# Patient Record
Sex: Male | Born: 1955 | Race: White | Hispanic: No | Smoking: Never smoker
Health system: Southern US, Community
[De-identification: ages and names within clinical notes are randomized; demographics above are authoritative.]

## PROBLEM LIST (undated history)

## (undated) DIAGNOSIS — C229 Malignant neoplasm of liver, not specified as primary or secondary: Secondary | ICD-10-CM

## (undated) DIAGNOSIS — E871 Hypo-osmolality and hyponatremia: Secondary | ICD-10-CM

## (undated) DIAGNOSIS — D649 Anemia, unspecified: Secondary | ICD-10-CM

## (undated) DIAGNOSIS — F1020 Alcohol dependence, uncomplicated: Secondary | ICD-10-CM

## (undated) HISTORY — DX: Anemia, unspecified: D64.9

## (undated) HISTORY — DX: Hypo-osmolality and hyponatremia: E87.1

## (undated) HISTORY — DX: Alcohol dependence, uncomplicated: F10.20

---

## 2018-09-09 ENCOUNTER — Encounter (HOSPITAL_COMMUNITY): Payer: Self-pay | Admitting: Emergency Medicine

## 2018-09-09 ENCOUNTER — Inpatient Hospital Stay (HOSPITAL_COMMUNITY)
Admission: EM | Admit: 2018-09-09 | Discharge: 2018-09-12 | DRG: 641 | Disposition: A | Discharge status: Home / Self Care | Payer: Medicaid Other | Attending: Internal Medicine | Admitting: Internal Medicine

## 2018-09-09 ENCOUNTER — Emergency Department (HOSPITAL_COMMUNITY): Payer: Medicaid Other

## 2018-09-09 DIAGNOSIS — G8929 Other chronic pain: Secondary | ICD-10-CM | POA: Diagnosis present

## 2018-09-09 DIAGNOSIS — D509 Iron deficiency anemia, unspecified: Secondary | ICD-10-CM | POA: Diagnosis present

## 2018-09-09 DIAGNOSIS — E46 Unspecified protein-calorie malnutrition: Secondary | ICD-10-CM | POA: Diagnosis present

## 2018-09-09 DIAGNOSIS — M549 Dorsalgia, unspecified: Secondary | ICD-10-CM | POA: Diagnosis present

## 2018-09-09 DIAGNOSIS — D649 Anemia, unspecified: Secondary | ICD-10-CM | POA: Diagnosis present

## 2018-09-09 DIAGNOSIS — I959 Hypotension, unspecified: Secondary | ICD-10-CM | POA: Diagnosis not present

## 2018-09-09 DIAGNOSIS — D539 Nutritional anemia, unspecified: Secondary | ICD-10-CM

## 2018-09-09 DIAGNOSIS — E871 Hypo-osmolality and hyponatremia: Secondary | ICD-10-CM | POA: Diagnosis present

## 2018-09-09 DIAGNOSIS — F1020 Alcohol dependence, uncomplicated: Secondary | ICD-10-CM | POA: Diagnosis present

## 2018-09-09 DIAGNOSIS — E876 Hypokalemia: Principal | ICD-10-CM | POA: Diagnosis present

## 2018-09-09 DIAGNOSIS — I509 Heart failure, unspecified: Secondary | ICD-10-CM

## 2018-09-09 LAB — I-STAT TROPONIN, ED: TROPONIN I, POC: 0 ng/mL (ref 0.00–0.08)

## 2018-09-09 LAB — COMPREHENSIVE METABOLIC PANEL
ALBUMIN: 2.1 g/dL — AB (ref 3.5–5.0)
ALK PHOS: 78 U/L (ref 38–126)
ALT: 11 U/L (ref 0–44)
ANION GAP: 7 (ref 5–15)
AST: 19 U/L (ref 15–41)
BILIRUBIN TOTAL: 1.2 mg/dL (ref 0.3–1.2)
BUN: 6 mg/dL — ABNORMAL LOW (ref 8–23)
CALCIUM: 7.6 mg/dL — AB (ref 8.9–10.3)
CO2: 20 mmol/L — ABNORMAL LOW (ref 22–32)
Chloride: 102 mmol/L (ref 98–111)
Creatinine, Ser: 0.59 mg/dL — ABNORMAL LOW (ref 0.61–1.24)
GLUCOSE: 89 mg/dL (ref 70–99)
POTASSIUM: 3.6 mmol/L (ref 3.5–5.1)
Sodium: 129 mmol/L — ABNORMAL LOW (ref 135–145)
TOTAL PROTEIN: 6.7 g/dL (ref 6.5–8.1)

## 2018-09-09 LAB — CBC WITH DIFFERENTIAL/PLATELET
BAND NEUTROPHILS: 1 %
BASOS PCT: 0 %
Basophils Absolute: 0 10*3/uL (ref 0.0–0.1)
Eosinophils Absolute: 0.1 10*3/uL (ref 0.0–0.5)
Eosinophils Relative: 2 %
HEMATOCRIT: 12.1 % — AB (ref 39.0–52.0)
HEMOGLOBIN: 3 g/dL — AB (ref 13.0–17.0)
LYMPHS PCT: 23 %
Lymphs Abs: 1.3 10*3/uL (ref 0.7–4.0)
MCH: 16.1 pg — ABNORMAL LOW (ref 26.0–34.0)
MCHC: 24.8 g/dL — ABNORMAL LOW (ref 30.0–36.0)
MCV: 65.1 fL — ABNORMAL LOW (ref 80.0–100.0)
MONOS PCT: 18 %
Monocytes Absolute: 1 10*3/uL (ref 0.1–1.0)
NEUTROS ABS: 3.2 10*3/uL (ref 1.7–7.7)
Neutrophils Relative %: 57 %
Platelets: 246 10*3/uL (ref 150–400)
RBC: 1.86 MIL/uL — AB (ref 4.22–5.81)
RDW: 26.6 % — ABNORMAL HIGH (ref 11.5–15.5)
WBC: 5.6 10*3/uL (ref 4.0–10.5)

## 2018-09-09 LAB — MRSA PCR SCREENING: MRSA by PCR: NEGATIVE

## 2018-09-09 LAB — POC OCCULT BLOOD, ED: Fecal Occult Bld: NEGATIVE

## 2018-09-09 LAB — PROTIME-INR
INR: 1.8
PROTHROMBIN TIME: 20.6 s — AB (ref 11.4–15.2)

## 2018-09-09 LAB — RETICULOCYTES
Immature Retic Fract: 30.7 % — ABNORMAL HIGH (ref 2.3–15.9)
RBC.: 2.73 MIL/uL — AB (ref 4.22–5.81)
RETIC COUNT ABSOLUTE: 44 10*3/uL (ref 19.0–186.0)
RETIC CT PCT: 1.6 % (ref 0.4–3.1)

## 2018-09-09 LAB — PREPARE RBC (CROSSMATCH)

## 2018-09-09 LAB — ABO/RH: ABO/RH(D): A NEG

## 2018-09-09 MED ORDER — VANCOMYCIN HCL IN DEXTROSE 1-5 GM/200ML-% IV SOLN
1000.0000 mg | Freq: Once | INTRAVENOUS | Status: AC
  Administered 2018-09-09: 1000 mg via INTRAVENOUS
  Filled 2018-09-09: qty 200

## 2018-09-09 MED ORDER — FUROSEMIDE 10 MG/ML IJ SOLN: 20.0000 mg | Freq: Two times a day (BID) | INTRAMUSCULAR | Status: DC

## 2018-09-09 MED ORDER — DEXMEDETOMIDINE HCL IN NACL 200 MCG/50ML IV SOLN
0.2000 ug/kg/h | INTRAVENOUS | Status: DC
  Administered 2018-09-09: 0.2 ug/kg/h via INTRAVENOUS
  Administered 2018-09-10: 0.5 ug/kg/h via INTRAVENOUS
  Filled 2018-09-09 (×2): qty 50

## 2018-09-09 MED ORDER — SODIUM CHLORIDE 0.9 % IV BOLUS
1000.0000 mL | Freq: Once | INTRAVENOUS | Status: AC
  Administered 2018-09-09: 1000 mL via INTRAVENOUS

## 2018-09-09 MED ORDER — VANCOMYCIN HCL IN DEXTROSE 750-5 MG/150ML-% IV SOLN
750.0000 mg | Freq: Two times a day (BID) | INTRAVENOUS | Status: DC
  Administered 2018-09-10: 750 mg via INTRAVENOUS
  Filled 2018-09-09: qty 150

## 2018-09-09 MED ORDER — ACETAMINOPHEN 325 MG PO TABS: 650.0000 mg | ORAL_TABLET | ORAL | Status: DC | PRN

## 2018-09-09 MED ORDER — FOLIC ACID 1 MG PO TABS
1.0000 mg | ORAL_TABLET | Freq: Every day | ORAL | Status: DC
  Administered 2018-09-10 – 2018-09-12 (×3): 1 mg via ORAL
  Filled 2018-09-09 (×3): qty 1

## 2018-09-09 MED ORDER — SODIUM CHLORIDE 0.9 % IV SOLN
2.0000 g | Freq: Two times a day (BID) | INTRAVENOUS | Status: DC
  Administered 2018-09-10: 2 g via INTRAVENOUS
  Filled 2018-09-09: qty 2

## 2018-09-09 MED ORDER — PANTOPRAZOLE SODIUM 40 MG IV SOLR
40.0000 mg | Freq: Two times a day (BID) | INTRAVENOUS | Status: DC
  Administered 2018-09-09 – 2018-09-10 (×2): 40 mg via INTRAVENOUS
  Filled 2018-09-09 (×2): qty 40

## 2018-09-09 MED ORDER — SODIUM CHLORIDE 0.9 % IV SOLN
10.0000 mL/h | Freq: Once | INTRAVENOUS | Status: AC
  Administered 2018-09-09: 10 mL/h via INTRAVENOUS

## 2018-09-09 MED ORDER — VITAMIN B-1 100 MG PO TABS
100.0000 mg | ORAL_TABLET | Freq: Every day | ORAL | Status: DC
  Administered 2018-09-09 – 2018-09-12 (×4): 100 mg via ORAL
  Filled 2018-09-09 (×4): qty 1

## 2018-09-09 MED ORDER — LORAZEPAM 2 MG/ML IJ SOLN: 1.0000 mg | INTRAMUSCULAR | Status: DC | PRN

## 2018-09-09 MED ORDER — SODIUM CHLORIDE 0.9 % IV SOLN
2.0000 g | Freq: Once | INTRAVENOUS | Status: AC
  Administered 2018-09-09: 2 g via INTRAVENOUS
  Filled 2018-09-09: qty 2

## 2018-09-09 NOTE — Progress Notes (Signed)
Pharmacy Antibiotic Note  Gregory Rowe is a 62 y.o. male admitted on 09/09/2018 with pneumonia.  Pharmacy has been consulted for cefepime and vancomycin dosing.  Currently, afebrile (Tmax 100.3) and WBC wnl. Estimated CrCl 74 mL/min per weight 150 kg provided by patient.   Plan: Cefepime 2 gm IV once per MD, then 2 gm IV q12h Vancomycin 1000 mg IV once per MD, then 750 mg IV q12h Monitor renal function and vancomycin trough at steady state F/u cultures, length of therapy, and weight     Temp (24hrs), Avg:99.7 F (37.6 C), Min:99 F (37.2 C), Max:100.3 F (37.9 C)  Recent Labs  Lab 09/09/18 1417 09/09/18 1430  WBC  --  5.6  CREATININE 0.59*  --     CrCl cannot be calculated (Unknown ideal weight.).    Allergies  Allergen Reactions  . Other Other (See Comments)    NARCOTICS; makes him angry    Antimicrobials this admission: Cefepime 11/7>> Vancomycin 11/7>>  Microbiology results: 11/7 BCx:  Thank you for allowing pharmacy to be a part of this patient's care.  Willia Craze, Pharmacy Student

## 2018-09-09 NOTE — ED Provider Notes (Signed)
[facility] EMERGENCY DEPARTMENT Provider Note   CSN: [id number] Arrival date & time: 09/09/18  1331     History   Chief Complaint No chief complaint on file.   HPI Gregory Rowe is a 62 y.o. male who presents with cc of generalized weakness.  He has a past medical history of alcohol abuse and does not follow regularly with a physician.  Patient states that he used to drink a case to a case and a half daily however he states that he only drinks about 5-6 beers daily but he does drink all day long.  The history is gathered from the patient, his son who is at bedside and a written statement from the patient's live-in girlfriend.  Apparently about 4 to 5 weeks ago the patient began getting swelling in his lower extremities along with significant exertional dyspnea.  Over the past 3 weeks he has had significantly worsening weakness and decline including having to use a cane or a walker and also the point that he is unable to get out of bed or help himself to ADLs.  He denies melena or hematochezia.  His son says he has not seen him in about 6 weeks and has noticed that he looks very pale.  His son also notices that over the past year he has had a 60 to 80 pound weight loss.  This has been unintentional.  The patient denies abdominal pain or distention.  He denies any known melena or hematochezia.  He has not been taking BC powders, or other NSAIDs on a regular basis.  She denies fevers or soaking night sweats.  He denies unilateral leg swelling, chest pain, hemoptysis.  He does acknowledge orthopnea.  HPI  No past medical history on file.  There are no active problems to display for this patient.         Home Medications    Prior to Admission medications   Not on File    Family History No family history on file.  Social History Social History   Tobacco Use  . Smoking status: Not on file  Substance Use Topics  . Alcohol use: Not on file  . Drug use: Not on  file     Allergies   Patient has no known allergies.   Review of Systems Review of Systems Ten systems reviewed and are negative for acute change, except as noted in the HPI.    Physical Exam Updated Vital Signs There were no vitals taken for this visit.  Physical Exam  Constitutional: He is oriented to person, place, and time. He appears well-developed and well-nourished.  Patient is extremely pale and appears older than stated age  HENT:  Head: Normocephalic and atraumatic.  Pale conjunctiva and pale oral mucosa  Eyes: Pupils are equal, round, and reactive to light. EOM are normal.  Neck: Normal range of motion. Neck supple.  Difficult to assess JVD secondary to facial hair  Cardiovascular: Normal rate and regular rhythm.  Pulmonary/Chest: Effort normal and breath sounds normal.  Abdominal: He exhibits distension. There is no tenderness.  Firm, distended abdomen without tenderness, rebound or guarding  Genitourinary:  Genitourinary Comments: Digital rectal exam reveals nonthrombosed external hemorrhoids, rectal exam with normal tone, brown stool on examining finger.  Musculoskeletal: He exhibits edema.  Patient with anasarca of the lower extremities, 3+ pitting edema.  Edema extends up to the navel with 1+ pitting edema of the lower abdomen.  Neurological: He is alert and oriented to person,  place, and time.  Nursing note and vitals reviewed.    ED Treatments / Results  Labs (all labs ordered are listed, but only abnormal results are displayed) Labs Reviewed - No data to display  EKG None  Radiology No results found.  Procedures .Critical Care Performed by: Margarita Mail, PA-C Authorized by: Margarita Mail, PA-C   Critical care provider statement:    Critical care time (minutes):  60   Critical care was necessary to treat or prevent imminent or life-threatening deterioration of the following conditions:  Circulatory failure, metabolic crisis and sepsis    Critical care was time spent personally by me on the following activities:  Discussions with consultants, evaluation of patient's response to treatment, examination of patient, ordering and performing treatments and interventions, ordering and review of laboratory studies, ordering and review of radiographic studies, pulse oximetry, re-evaluation of patient's condition, obtaining history from patient or surrogate and review of old charts   (including critical care time)  Medications Ordered in ED Medications - No data to display   Initial Impression / Assessment and Plan / ED Course  I have reviewed the triage vital signs and the nursing notes.  Pertinent labs & imaging results that were available during my care of the patient were reviewed by me and considered in my medical decision making (see chart for details).  Clinical Course as of Sep 10 1623  Thu Sep 09, 2018  1421 This is a 62 year old male who appears extremely pale and extremely anemic.  I have concern given his history of alcohol abuse of hepatorenal failure or insufficiency, alcoholic cardiomyopathy, extreme anemia from GI bleed.  Also on my differential is cancer.  The patient's work-up is currently pending.  The patient was found  be febrile to 100.3.  I doubt spontaneous bacterial peritonitis.   [AH]  1536 Sodium(!): 129 [AH]  1536 Calcium(!): 7.6 [AH]  1536    Albumin(!): 2.1 [AH]  1545 Fecal Occult Blood, POC: NEGATIVE [AH]  1617 patient   [AH]  1620 Patient hgb 3.0  I had a long conversation with the lab who had tried to run the lab x2 on 2 separate blood draws.  They had to take a sensor on the machine that they had to remove. I have ordered 2 units of blood  and 2 units ahead.   [AH]    Clinical Course User Index [AH] Margarita Mail, PA-C    Patient with critically low hemoglobin.  He is getting 2 units of packed red blood cells currently.  I spoke with Dr. Elsworth Soho of critical care who will consult on the patient.   I am concerned with his third spacing and need for fluids as well as potential for early sepsis and pneumonia that he may have significant decompensation of his respiratory status as well as his tachypnea. patient signed out given to Dr. Lavell Islam and Dr. Sherry Ruffing.  Final Clinical Impressions(s) / ED Diagnoses   Final diagnoses:  None    ED Discharge Orders    None       Margarita Mail, PA-C 09/09/18 1654    Pattricia Boss, MD 09/09/18 2035

## 2018-09-09 NOTE — ED Triage Notes (Signed)
Patient is brought to the ED by son. Son reports visiting yesterday and patient is weak and using walker which is not "normal."

## 2018-09-09 NOTE — ED Notes (Signed)
CC MD at bedside 

## 2018-09-09 NOTE — ED Notes (Signed)
No medical history obtained from patient and son. Reports, that patient does not "see any doctors"

## 2018-09-09 NOTE — ED Notes (Signed)
ED Provider at bedside. 

## 2018-09-09 NOTE — ED Notes (Addendum)
Patient said he does not want blood until his son comes back to the room. Son is on the way EDP notified

## 2018-09-09 NOTE — H&P (Signed)
NAME:  Gregory Rowe, MRN:  [id number], DOB:  October 19, 1956, LOS: 0 ADMISSION DATE:  09/09/2018, CONSULTATION DATE:  09/09/2018 REFERRING MD:  Dr. Sherry Ruffing, CHIEF COMPLAINT:  Weakness/ SOB  Brief History   41 yoM with heavy ETOH use- last drink 11/7, no known significant history but hasnt seen provider in years presenting with severe progressive weakness, SOB, and BLE swelling.  Hgb at 3 with borderline hypotensive blood pressure.   Past Medical History  ETOH use, hip fx 7-10 years ago, chronic back pain  Significant Hospital Events   11/7 Admit  Consults: date of consult/date signed off & final recs:   Procedures (surgical and bedside):  Bilateral 20g PIV  Significant Diagnostic Tests:  11/7 CXR  >> Right greater than left effusions and airspace disease which could be atelectasis or pneumonia.  Cardiomegaly without edema.  Atherosclerosis  Micro Data:  11/7 MRSA PCR >> 11/7 BCx 2 >>  Antimicrobials:  11/7 vanc x 1 11/7 cefepime x 1 in ER  Subjective:  No complaints.  Dyspnea only with exertion.  Hungry asking for cheeseburger.   Objective   Blood pressure (!) 113/55, pulse 88, temperature 99 F (37.2 C), temperature source Oral, resp. rate (!) 29, SpO2 100 %. on room air.        No intake or output data in the 24 hours ending 09/09/18 1740 There were no vitals filed for this visit.  Examination: General:  Older than stated age adult male sitting in bed in NAD HEENT: MM pale/ moist, no apparent JVD, pupils 3/reactive, anicteric  Neuro: Alert, oriented, non focal, generalized weakness  CV: RR, SR 80's, +2 pulses, no murmur PULM: even/non-labored, tachypneic, lungs bilaterally clear anteriorly, diminished in bases  GI: minimally taut, non tender, no obvious fluid wave or hepatomegaly, +BS Extremities: warm/dry, 2-3+ pitting edema up to thighs Skin: no rashes, pale  Resolved Hospital Problem list    Assessment & Plan:  Hypervolemia- will need to rule out heart failure  vs cirrhosis, concern for cardiomyopathy with long-term heavy ETOH,  -  LFTs are remarkably normal  - negative trop and non acute EKG  P:  Tele monitoring Goal MAP > 65 TTE  Lasix 20mg  q 12 hours Strict I/O's and daily weights    Bilateral pleural effusions +/- atelectasis vs infiltrate - currently maintaining saturations on room air  - denies prior infectious symptoms, WBC normal  P:  Diuresis as above Continue vanc/ cefepime for now Check PCT, if negative, likely will d/c abx    ETOH - heavy and chronic, last drink around noon 11/7 - normal LFTs  - normal mentation without current signs of withdrawal  P:  High risk for withdrawal Precedex per CIWA protocol  Daily thiamine and folate Prn Ativan for seizures    Microcytic Anemia  - neg FOBT in ER - likely chronic given progressive nature of symptoms P:  Receiving 2 units PRBC now Recheck Hgb after transfusion Goal > 7 Iron studies ordered with B1 level  Check occult stool x 3 Consider GI evaluation pending iron studies/ ETOH hx  PPI BID for now   Hyponatremia - hypervolemic/ hypotonic -serum Osm calculated without current ETOH level at 265 Hypokalemia- mild  P:  Check mag/ phos  check UA  Diuresis as above Repeat BMET in am   Disposition / Summary of Today's Plan 09/09/18   Admit to ICU overnight for observation.  If remains stable can likely transfer out tomorrow pending studies and if remains hemodynamically stable and  off precedex.    Diet: Full liquid Pain/Anxiety/Delirium protocol (if indicated): precedex prn CIWA protocol  VAP protocol (if indicated): n/a DVT prophylaxis: SCDs only for now GI prophylaxis: protonix BID Hyperglycemia protocol: n/a  Mobility: BR Code Status: Full  Family Communication: Patient and his son, Izell  [phone] or [phone]) updated at bedside on plan of care.   Labs   CBC: Recent Labs  Lab 09/09/18 1430  WBC 5.6  NEUTROABS 3.2  HGB 3.0*  HCT 12.1*  MCV  65.1*  PLT 638    Basic Metabolic Panel: Recent Labs  Lab 09/09/18 1417  NA 129*  K 3.6  CL 102  CO2 20*  GLUCOSE 89  BUN 6*  CREATININE 0.59*  CALCIUM 7.6*   GFR: CrCl cannot be calculated (Unknown ideal weight.). Recent Labs  Lab 09/09/18 1430  WBC 5.6    Liver Function Tests: Recent Labs  Lab 09/09/18 1417  AST 19  ALT 11  ALKPHOS 78  BILITOT 1.2  PROT 6.7  ALBUMIN 2.1*   No results for input(s): LIPASE, AMYLASE in the last 168 hours. No results for input(s): AMMONIA in the last 168 hours.  ABG No results found for: PHART, PCO2ART, PO2ART, HCO3, TCO2, ACIDBASEDEF, O2SAT   Coagulation Profile: Recent Labs  Lab 09/09/18 1417  INR 1.80    Cardiac Enzymes: No results for input(s): CKTOTAL, CKMB, CKMBINDEX, TROPONINI in the last 168 hours.  HbA1C: No results found for: HGBA1C  CBG: No results for input(s): GLUCAP in the last 168 hours.  Admitting History of Present Illness.   62 year old male with medical history significant for ETOH use.  He drinks 5-6 beers a day but that is recently down from around 24 beers a day as he states he just hasn't felt like drinking.  Last drink was today around noon.  He has had heavy ETOH use for years with questionable withdrawals when hospitalized for his hip fracture secondary to fall 7-10 years.  Has not seen a provider since he fractured his hip.  He lives in [place] with his girlfriend.  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    He present after his son found him yesterday at home pale and with weakness so severe, he was unable to get out of bed.  Reports progressive weakness over the last 6 months to where he has been using a walker, bilateral lower leg swelling over 1-2 months, and dyspnea with exertion for same.  Some orthopnea he attributes to not being able to lay flat due to back pain.  He denies any OTC or RX medication use.  Denies taking medication for back pain.  He is a  never smoker, only exposure to second hand smoke.  Reports a good appetite without any changes in bowel or bloody stools. Son thinks he has had significant weight loss over the last 6 months to year, which has been unintentional.  Denies fever, chills, cough, hemoptysis, rectal bleeding, chest or abdominal pain, or urinary changes.  SOB only exertional.    In ER, blood pressure soft with systolic in the 75-64'P with some improvement after 1L bolus in ER,  temp 100.3, tachypnea in the upper 20's and NSR in the 80's.  EKG without acute changes, trop neg, Hgb noted to be 3, HCT 12.1 with negative FOBT, WBC 5.6, Na 129, K 3.6, sCr 0.59, INR 1.80, normal LFTs, CXR with bilateral pleural effusion, right greater than left.  PCCM  called for admission.    Review of Systems:   POSITIVES IN BOLD Gen: Denies fever, chills, weight loss, fatigue, night sweats HEENT: Denies blurred vision, double vision, hearing loss, tinnitus, sinus congestion, rhinorrhea, sore throat, neck stiffness, dysphagia PULM: Denies shortness of breath, cough, sputum production, hemoptysis, wheezing CV: Denies chest pain, edema, orthopnea, paroxysmal nocturnal dyspnea, palpitations GI: Denies abdominal pain, nausea, vomiting, diarrhea, hematochezia, melena, constipation, change in bowel habits GU: Denies dysuria, hematuria, polyuria, oliguria, urethral discharge Endocrine: Denies hot or cold intolerance, polyuria, polyphagia or appetite change Derm: Denies rash or skin change Heme: Denies easy bruising, bleeding Neuro: Denies headache, dizziness, numbness, generalized weakness, slurred speech, loss of memory or consciousness  Past Medical History  He,  has no past medical history on file.   Surgical History   Prior hx fracture 7-10 years ago, treated in [facility].  Social History   Social History   Socioeconomic History  . Marital status: Single    Spouse name: Not on file  . Number of children: Not on file  . Years of  education: Not on file  . Highest education level: Not on file  Occupational History  . Not on file  Social Needs  . Financial resource strain: Not on file  . Food insecurity:    Worry: Not on file    Inability: Not on file  . Transportation needs:    Medical: Not on file    Non-medical: Not on file  Tobacco Use  . Smoking status: Not on file  Substance and Sexual Activity  . Alcohol use: Not on file  . Drug use: Not on file  . Sexual activity: Not on file  Lifestyle  . Physical activity:    Days per week: Not on file    Minutes per session: Not on file  . Stress: Not on file  Relationships  . Social connections:    Talks on phone: Not on file    Gets together: Not on file    Attends religious service: Not on file    Active member of club or organization: Not on file    Attends meetings of clubs or organizations: Not on file    Relationship status: Not on file  . Intimate partner violence:    Fear of current or ex partner: Not on file    Emotionally abused: Not on file    Physically abused: Not on file    Forced sexual activity: Not on file  Other Topics Concern  . Not on file  Social History Narrative  . Not on file  ,     Family History   His family history is not on file.   Allergies Allergies  Allergen Reactions  . Other Other (See Comments)    NARCOTICS; makes him angry     Home Medications  Prior to Admission medications   Not on File     Critical care time: 60 mins     Kennieth Rad, AGACNP-BC [place] Pgr: [phone] or if no answer [phone] 09/09/2018, 6:32 PM

## 2018-09-09 NOTE — ED Provider Notes (Signed)
Transfer of Care Note I assumed care of patient from Margarita Mail at 1600.  Agree with history, physical exam and plan.  See their note for further details.    Briefly, the patient is a 62 yo male with PMHx of EtOH abuse who presents with generalized weakness. Patient has been experiencing worsening edema, orthopnea, and weakness over the past three weeks. It has now gotten to the point that the patient is so week he has been unable to get out of his bed without assistance for the past few days.   Current plan is as follows: The ICU has been contacted for admission as the patient has multiple laboratory abnormalities and a chest xray that is concerning for pneumonia. Given patient's hemoglobin of 3, we are in the process of consenting the patient for blood.   Reassessment: I personally reassessed the patient. He consented to receiving blood products and they were started. The ICU provider assessed the patient in the ED and he was accepted to their service for further evaluation and care.   HDS at transfer.  The care of this patient was supervised by Dr. Sherry Ruffing, who agreed with the plan and management of the patient.   Clinical Impression: 1. Symptomatic anemia     ED Dispo: Admit   Chrisha Vogel, Chanda Busing, MD 09/10/18 [place], Gwenyth Allegra, MD 09/10/18 [phone]

## 2018-09-10 ENCOUNTER — Inpatient Hospital Stay (HOSPITAL_COMMUNITY): Payer: Medicaid Other

## 2018-09-10 ENCOUNTER — Other Ambulatory Visit: Payer: Self-pay

## 2018-09-10 ENCOUNTER — Encounter (HOSPITAL_COMMUNITY): Payer: Self-pay | Admitting: General Practice

## 2018-09-10 LAB — POCT I-STAT 3, ART BLOOD GAS (G3+)
ACID-BASE DEFICIT: 6 mmol/L — AB (ref 0.0–2.0)
Bicarbonate: 18.8 mmol/L — ABNORMAL LOW (ref 20.0–28.0)
O2 Saturation: 97 %
PH ART: 7.354 (ref 7.350–7.450)
TCO2: 20 mmol/L — ABNORMAL LOW (ref 22–32)
pCO2 arterial: 33.7 mmHg (ref 32.0–48.0)
pO2, Arterial: 93 mmHg (ref 83.0–108.0)

## 2018-09-10 LAB — HIV ANTIBODY (ROUTINE TESTING W REFLEX): HIV Screen 4th Generation wRfx: NONREACTIVE

## 2018-09-10 LAB — BASIC METABOLIC PANEL
Anion gap: 5 (ref 5–15)
BUN: 7 mg/dL — ABNORMAL LOW (ref 8–23)
CHLORIDE: 104 mmol/L (ref 98–111)
CO2: 20 mmol/L — AB (ref 22–32)
CREATININE: 0.65 mg/dL (ref 0.61–1.24)
Calcium: 7.3 mg/dL — ABNORMAL LOW (ref 8.9–10.3)
GFR calc Af Amer: >60 mL/min (ref >60)
GFR calc non Af Amer: >60 mL/min (ref >60)
GLUCOSE: 92 mg/dL (ref 70–99)
POTASSIUM: 3.5 mmol/L (ref 3.5–5.1)
Sodium: 129 mmol/L — ABNORMAL LOW (ref 135–145)

## 2018-09-10 LAB — CBC
HCT: 23.8 % — ABNORMAL LOW (ref 39.0–52.0)
HCT: 28.9 % — ABNORMAL LOW (ref 39.0–52.0)
HEMATOCRIT: 28.3 % — AB (ref 39.0–52.0)
HEMOGLOBIN: 7 g/dL — AB (ref 13.0–17.0)
HEMOGLOBIN: 8.5 g/dL — AB (ref 13.0–17.0)
HEMOGLOBIN: 8.7 g/dL — AB (ref 13.0–17.0)
MCH: 22.6 pg — AB (ref 26.0–34.0)
MCH: 23.2 pg — ABNORMAL LOW (ref 26.0–34.0)
MCH: 23.4 pg — ABNORMAL LOW (ref 26.0–34.0)
MCHC: 29.4 g/dL — AB (ref 30.0–36.0)
MCHC: 30 g/dL (ref 30.0–36.0)
MCHC: 30.1 g/dL (ref 30.0–36.0)
MCV: 76.8 fL — AB (ref 80.0–100.0)
MCV: 77.1 fL — AB (ref 80.0–100.0)
MCV: 77.7 fL — ABNORMAL LOW (ref 80.0–100.0)
NRBC: 0 % (ref 0.0–0.2)
PLATELETS: 224 10*3/uL (ref 150–400)
Platelets: 229 10*3/uL (ref 150–400)
Platelets: 226 10*3/uL (ref 150–400)
RBC: 3.1 MIL/uL — AB (ref 4.22–5.81)
RBC: 3.67 MIL/uL — AB (ref 4.22–5.81)
RBC: 3.72 MIL/uL — ABNORMAL LOW (ref 4.22–5.81)
RDW: 26.7 % — ABNORMAL HIGH (ref 11.5–15.5)
RDW: 24.5 % — ABNORMAL HIGH (ref 11.5–15.5)
RDW: 24.8 % — ABNORMAL HIGH (ref 11.5–15.5)
WBC: 9.6 10*3/uL (ref 4.0–10.5)
WBC: 8.7 10*3/uL (ref 4.0–10.5)
WBC: 8 10*3/uL (ref 4.0–10.5)
nRBC: 0.2 % (ref 0.0–0.2)
nRBC: 0.4 % — ABNORMAL HIGH (ref 0.0–0.2)

## 2018-09-10 LAB — IRON AND TIBC
Iron: 114 ug/dL (ref 45–182)
Saturation Ratios: 39 % (ref 17.9–39.5)
TIBC: 295 ug/dL (ref 250–450)
UIBC: 181 ug/dL

## 2018-09-10 LAB — PROCALCITONIN: PROCALCITONIN: 0.39 ng/mL

## 2018-09-10 LAB — MAGNESIUM: Magnesium: 1.9 mg/dL (ref 1.7–2.4)

## 2018-09-10 LAB — PREPARE RBC (CROSSMATCH)

## 2018-09-10 LAB — PHOSPHORUS: Phosphorus: 3.2 mg/dL (ref 2.5–4.6)

## 2018-09-10 LAB — FERRITIN: FERRITIN: 6 ng/mL — AB (ref 24–336)

## 2018-09-10 MED ORDER — FUROSEMIDE 10 MG/ML IJ SOLN
20.0000 mg | Freq: Once | INTRAMUSCULAR | Status: AC
  Administered 2018-09-10: 20 mg via INTRAVENOUS
  Filled 2018-09-10: qty 2

## 2018-09-10 MED ORDER — SODIUM CHLORIDE 0.9% IV SOLUTION
Freq: Once | INTRAVENOUS | Status: AC
  Administered 2018-09-10: 07:00:00 via INTRAVENOUS

## 2018-09-10 MED ORDER — POTASSIUM CHLORIDE CRYS ER 20 MEQ PO TBCR
40.0000 meq | EXTENDED_RELEASE_TABLET | Freq: Once | ORAL | Status: AC
  Administered 2018-09-10: 20 meq via ORAL
  Filled 2018-09-10: qty 2

## 2018-09-10 MED ORDER — FUROSEMIDE 10 MG/ML IJ SOLN
40.0000 mg | Freq: Once | INTRAMUSCULAR | Status: AC
  Administered 2018-09-10: 40 mg via INTRAVENOUS
  Filled 2018-09-10: qty 4

## 2018-09-10 MED ORDER — SODIUM CHLORIDE 0.9% IV SOLUTION
Freq: Once | INTRAVENOUS | Status: AC
  Administered 2018-09-10: 01:00:00 via INTRAVENOUS

## 2018-09-10 MED ORDER — PANTOPRAZOLE SODIUM 40 MG PO TBEC
40.0000 mg | DELAYED_RELEASE_TABLET | Freq: Every day | ORAL | Status: DC
  Administered 2018-09-11: 40 mg via ORAL
  Filled 2018-09-10: qty 1

## 2018-09-10 MED ORDER — ACETAMINOPHEN 325 MG PO TABS: 650.0000 mg | ORAL_TABLET | Freq: Four times a day (QID) | ORAL | Status: DC | PRN

## 2018-09-10 NOTE — Progress Notes (Signed)
TEAM 1 - Stepdown/ICU TEAM  Gregory Rowe  DXI:[id number] DOB: 02-26-1956 DOA: 09/09/2018 PCP: Patient, No Pcp Per    Brief Narrative:  62 yo M with heavy ETOH use - last drink 11/7, no known significant history but hasnt seen provider in years presenting with severe progressive weakness, SOB, and BLE swelling.  In the ED he was found to have a Hgb of 3 with hypotension.   Significant Events: 11/7 admit to [facility]   Subjective: Denies any complaints. Odd affect. Continues to ready paper despite attempts to interview. Will not look at examiner. Denies any complaints whatsoever, other than feeling the K-Dur pill stuck in his throat.   Assessment & Plan:  EtOH abuse Recently cut back to 5-6 beers/day from prior 24 beers/day - high risk for withdrawal - follow in SDU for now   Profound microcytic anemia  S/p 5U total PRBC - Hgb has improved accordingly - thus far no evidence of actual blood loss, but ferritin is 6 - follow trend   Recent Labs  Lab 09/09/18 1430 09/09/18 2322 09/10/18 0432  HGB 3.0* 5.6* 7.0*    Hyponatremia  Likely beer drinkers potomania - follow trend  Recent Labs  Lab 09/09/18 1417 09/10/18 0432  NA 129* 129*    B Pleural effusions - Mild LE edema Likely due to low oncotic pressure in setting of malnutrition of EtOH abuse - albumin 2.1 - f/u TTE    DVT prophylaxis: SCDs Code Status: FULL CODE Family Communication: no family present at time of exam  Disposition Plan: stable for SDU   Consultants:  PCCM  Antimicrobials:  11/7 vanc 11/7 cefepime   Objective: Blood pressure 102/62, pulse 84, temperature 97.6 F (36.4 C), temperature source Oral, resp. rate (!) 26, weight 86.2 kg, SpO2 93 %.  Intake/Output Summary (Last 24 hours) at 09/10/2018 1025 Last data filed at 09/10/2018 0900 Gross per 24 hour  Intake 2488.56 ml  Output 650 ml  Net 1838.56 ml   Filed Weights   09/09/18 1900 09/10/18 0500  Weight: 82.9 kg 86.2 kg     Examination: General: No acute respiratory distress Lungs: mild blunting of breath sounds B bases - no wheezing  Cardiovascular: Regular rate and rhythm without murmur gallop or rub normal S1 and S2 Abdomen: Nontender, nondistended, soft, bowel sounds positive, no rebound, no ascites, no appreciable mass Extremities: 1+ B LE edema   CBC: Recent Labs  Lab 09/09/18 1430 09/09/18 2322 09/10/18 0432  WBC 5.6 11.3* 9.6  NEUTROABS 3.2  --   --   HGB 3.0* 5.6* 7.0*  HCT 12.1* 20.8* 23.8*  MCV 65.1* 75.6* 76.8*  PLT 246 257 767   Basic Metabolic Panel: Recent Labs  Lab 09/09/18 1417 09/10/18 0432  NA 129* 129*  K 3.6 3.5  CL 102 104  CO2 20* 20*  GLUCOSE 89 92  BUN 6* 7*  CREATININE 0.59* 0.65  CALCIUM 7.6* 7.3*  MG  --  1.9  PHOS  --  3.2   GFR: CrCl cannot be calculated (Unknown ideal weight.).  Liver Function Tests: Recent Labs  Lab 09/09/18 1417  AST 19  ALT 11  ALKPHOS 78  BILITOT 1.2  PROT 6.7  ALBUMIN 2.1*    Coagulation Profile: Recent Labs  Lab 09/09/18 1417  INR 1.80    Recent Results (from the past 240 hour(s))  MRSA PCR Screening     Status: None   Collection Time: 09/09/18  7:58 PM  Result Value Ref Range  Status   MRSA by PCR NEGATIVE NEGATIVE Final    Comment:        The GeneXpert MRSA Assay (FDA approved for NASAL specimens only), is one component of a comprehensive MRSA colonization surveillance program. It is not intended to diagnose MRSA infection nor to guide or monitor treatment for MRSA infections. Performed at [facility], [place] [address] [place], [place] [address]      Scheduled Meds: . folic acid  1 mg Oral Daily  . furosemide  20 mg Intravenous Q12H  . furosemide  40 mg Intravenous Once  . pantoprazole (PROTONIX) IV  40 mg Intravenous Q12H  . thiamine  100 mg Oral Daily     LOS: 1 day   Cherene Altes, MD [facility] Office  [phone] Pager - Text Page per Amion  If 7PM-7AM, please  contact night-coverage per Amion 09/10/2018, 10:25 AM

## 2018-09-10 NOTE — Progress Notes (Signed)
[name] Progress Note Patient Name: Gregory Rowe DOB: [birth year] MRN: [id number]   Date of Service  09/10/2018  HPI/Events of Note  Patient noted to be more somnolent, but arousable. S/p 2 more units PRBC given furosemide prior with 400 cc output. BP borderline.  eICU Interventions  Will obtain labs now ABG ordered     Intervention Category Major Interventions: Change in mental status - evaluation and management  Judd Lien 09/10/2018, 4:28 AM

## 2018-09-10 NOTE — Procedures (Signed)
Patient unwilling to turn on side for echo due to shortness of breath and requests test be done some other time.

## 2018-09-10 NOTE — Progress Notes (Signed)
CRITICAL VALUE ALERT  Critical Value:  Hemoglobin 5.6  Date & Time Notied:  09/10/18 0000  Provider Notified: [facility]  Orders Received/Actions taken: administer 2 PRBC

## 2018-09-10 NOTE — Progress Notes (Signed)
[facility] Progress Note Patient Name: Gregory Rowe DOB: Mar 14, 1956 MRN: [id number]   Date of Service  09/10/2018  HPI/Events of Note  H/H 7/23.8 from 5.6/20.8 after 2 units PRBC. Potassium 3.5. BP borderline low  eICU Interventions  Transfuse 1 more unit PRBC. Ordered potassium 40 meqs PO     Intervention Category Major Interventions: Hypotension - evaluation and management Intermediate Interventions: Electrolyte abnormality - evaluation and management  Judd Lien 09/10/2018, 6:23 AM

## 2018-09-10 NOTE — Plan of Care (Signed)
Patient alert and oriented.  Does have generalized weakness. Vital signs stable on room air.

## 2018-09-10 NOTE — Care Management Note (Signed)
Case Management Note  Patient Details  Name: Maribel Luis MRN: [id number] Date of Birth: [birth year]  Subjective/Objective:  62yo male presented severe progressive weakness, SOB and BLE swelling.            Action/Plan: CM met with patient/ex-wife to discuss transitional needs. Patient lives at home with his significant other, independent with ADLs, utilizes a RW/cane to assist with ambulation. Patient confirmed no health insurance, nor a local PCP, but agreeable to CM arranging a hospital f/u appointment. Hospital f/u appointment arranged with: [facility] 09/15/18 @ 0840 with Mackie Pai, PA; New Patient appointment with [facility] 09/20/18 @ 1340; AVS updated. Patient can utilize [facility] for Rx needs. Patient's family will provide transportation home. CM team will continue to follow.   Expected Discharge Date:                  Expected Discharge Plan:  Home/Self Care  In-House Referral:  Clinical Social Work  Discharge planning Services  Medication Assistance, Follow-up appt scheduled  Post Acute Care Choice:  NA Choice offered to:  NA  DME Arranged:  N/A DME Agency:  NA  HH Arranged:  NA HH Agency:  NA  Status of Service:  In process, will continue to follow  If discussed at Long Length of Stay Meetings, dates discussed:    Additional Comments:  Midge Minium RN, BSN, NCM-BC, ACM-RN [phone] 09/10/2018, 3:09 PM

## 2018-09-10 NOTE — Progress Notes (Signed)
62 year old man heavy beer drinker admitted with shortness of breath, pedal edema and found to have hemoglobin of 3 ,MCV 67 and stool occult blood negative.  No history of melena.  He was transfused 4 units PRBC with hemoglobin improving to 7 Only made 600 cc of urine with Lasix 20 On exam-complains of coughing after potassium pill was given, currently no respiratory distress, no icterus, mild pallor, 2+ bipedal edema, S1-S2 tacky, no rub or gallop, decreased breath sounds bilateral  Labs reviewed which showed low ferritin but surprisingly good iron saturation, low procalcitonin, mild hypokalemia and relatively low reticulocyte count.  Impression/plan Severe microcytic anemia -stool occult blood negative on admission but still need to check more.  Surprisingly iron saturation appears okay although ferritin very low.  Relatively low reticulocyte count also suggests decreased production. He has been transfused 1 more unit after hemoglobin of 7, total 5 U PRBCs  Congestive heart failure-obtain echo, this may be high output heart failure from severe anemia alone or may be cardiomyopathy from alcohol or thiamine deficiency. Lasix 40 mg IV in addition to enable him to lie down.  Watch closely for alcohol withdrawal with Ativan per CIWA protocol  Discontinue empiric antibiotics that were started while awaiting cultures, MRSA PCR negative  He can transfer to telemetry now and will defer to triad service about other issues  Ritik Stavola V. Elsworth Soho MD [phone]

## 2018-09-11 ENCOUNTER — Other Ambulatory Visit (HOSPITAL_COMMUNITY): Payer: Self-pay

## 2018-09-11 ENCOUNTER — Inpatient Hospital Stay (HOSPITAL_COMMUNITY): Payer: Medicaid Other

## 2018-09-11 DIAGNOSIS — I361 Nonrheumatic tricuspid (valve) insufficiency: Secondary | ICD-10-CM

## 2018-09-11 LAB — BPAM RBC
BLOOD PRODUCT EXPIRATION DATE: 201912082359
BLOOD PRODUCT EXPIRATION DATE: 201912082359
Blood Product Expiration Date: 201912072359
Blood Product Expiration Date: 201912082359
Blood Product Expiration Date: 201912082359
ISSUE DATE / TIME: 201911071719
ISSUE DATE / TIME: 201911072026
ISSUE DATE / TIME: 201911080228
ISSUE DATE / TIME: 201911080037
ISSUE DATE / TIME: 201911080650
UNIT TYPE AND RH: 600
UNIT TYPE AND RH: 600
Unit Type and Rh: 600
Unit Type and Rh: 600
Unit Type and Rh: 600

## 2018-09-11 LAB — COMPREHENSIVE METABOLIC PANEL
ALK PHOS: 69 U/L (ref 38–126)
ALT: 12 U/L (ref 0–44)
AST: 18 U/L (ref 15–41)
Albumin: 1.8 g/dL — ABNORMAL LOW (ref 3.5–5.0)
Anion gap: 5 (ref 5–15)
BILIRUBIN TOTAL: 2.6 mg/dL — AB (ref 0.3–1.2)
BUN: 8 mg/dL (ref 8–23)
CALCIUM: 7.3 mg/dL — AB (ref 8.9–10.3)
CO2: 22 mmol/L (ref 22–32)
CREATININE: 0.67 mg/dL (ref 0.61–1.24)
Chloride: 104 mmol/L (ref 98–111)
GFR calc non Af Amer: >60 mL/min (ref >60)
Glucose, Bld: 97 mg/dL (ref 70–99)
Potassium: 2.7 mmol/L — CL (ref 3.5–5.1)
Sodium: 131 mmol/L — ABNORMAL LOW (ref 135–145)
Total Protein: 6.2 g/dL — ABNORMAL LOW (ref 6.5–8.1)

## 2018-09-11 LAB — CBC
HCT: 20.8 % — ABNORMAL LOW (ref 39.0–52.0)
HEMATOCRIT: 26.1 % — AB (ref 39.0–52.0)
HEMOGLOBIN: 8.1 g/dL — AB (ref 13.0–17.0)
Hemoglobin: 5.6 g/dL — CL (ref 13.0–17.0)
MCH: 20.4 pg — ABNORMAL LOW (ref 26.0–34.0)
MCH: 23.5 pg — ABNORMAL LOW (ref 26.0–34.0)
MCHC: 26.9 g/dL — AB (ref 30.0–36.0)
MCHC: 31 g/dL (ref 30.0–36.0)
MCV: 75.6 fL — ABNORMAL LOW (ref 80.0–100.0)
MCV: 75.9 fL — AB (ref 80.0–100.0)
NRBC: 0 % (ref 0.0–0.2)
Platelets: 257 10*3/uL (ref 150–400)
Platelets: 205 10*3/uL (ref 150–400)
RBC: 2.75 MIL/uL — ABNORMAL LOW (ref 4.22–5.81)
RBC: 3.44 MIL/uL — AB (ref 4.22–5.81)
RDW: 25.8 % — ABNORMAL HIGH (ref 11.5–15.5)
WBC: 11.3 10*3/uL — AB (ref 4.0–10.5)
WBC: 7.3 10*3/uL (ref 4.0–10.5)
nRBC: 0 % (ref 0.0–0.2)

## 2018-09-11 LAB — TYPE AND SCREEN
ABO/RH(D): A NEG
Antibody Screen: NEGATIVE
UNIT DIVISION: 0
UNIT DIVISION: 0
Unit division: 0
Unit division: 0
Unit division: 0

## 2018-09-11 LAB — ECHOCARDIOGRAM COMPLETE: WEIGHTICAEL: 2931.24 [oz_av]

## 2018-09-11 MED ORDER — LORAZEPAM 2 MG/ML IJ SOLN: 2.0000 mg | INTRAMUSCULAR | Status: DC | PRN

## 2018-09-11 MED ORDER — POTASSIUM CHLORIDE 10 MEQ/100ML IV SOLN
10.0000 meq | INTRAVENOUS | Status: AC
  Administered 2018-09-11 (×2): 10 meq via INTRAVENOUS
  Filled 2018-09-11 (×2): qty 100

## 2018-09-11 MED ORDER — POTASSIUM CHLORIDE CRYS ER 20 MEQ PO TBCR
40.0000 meq | EXTENDED_RELEASE_TABLET | Freq: Once | ORAL | Status: AC
  Administered 2018-09-11: 40 meq via ORAL
  Filled 2018-09-11: qty 2

## 2018-09-11 MED ORDER — POTASSIUM CHLORIDE CRYS ER 20 MEQ PO TBCR
40.0000 meq | EXTENDED_RELEASE_TABLET | Freq: Three times a day (TID) | ORAL | Status: DC
  Administered 2018-09-11 – 2018-09-12 (×4): 40 meq via ORAL
  Filled 2018-09-11 (×3): qty 2

## 2018-09-11 MED ORDER — SENNOSIDES-DOCUSATE SODIUM 8.6-50 MG PO TABS
1.0000 | ORAL_TABLET | Freq: Two times a day (BID) | ORAL | Status: DC
  Administered 2018-09-11: 1 via ORAL
  Filled 2018-09-11: qty 1

## 2018-09-11 NOTE — Progress Notes (Signed)
pts son asking for update. Update provided. Son then stated that he had been brining his father beer into the hospital every night when he visited because he is a "good son". This RN advised him to not bring any more into the hospital and explained the reasons why. Will monitor.

## 2018-09-11 NOTE — Progress Notes (Signed)
  Echocardiogram 2D Echocardiogram has been performed.  Johny Chess 09/11/2018, 4:27 PM

## 2018-09-11 NOTE — Progress Notes (Signed)
[facility] TEAM 1 - Stepdown/ICU TEAM  Royale Swamy  DDU:[id number] DOB: 21-May-1956 DOA: 09/09/2018 PCP: Patient, No Pcp Per    Brief Narrative:  62 yo M with heavy ETOH use - last drink 11/7, no known significant history but hasnt seen provider in years presenting with severe progressive weakness, SOB, and BLE swelling.  In the ED he was found to have a Hgb of 3 with hypotension.   Significant Events: 11/7 admit to [facility]   Subjective: Awake and alert. More interactive today. Denies any complaints whatsoever. Does admit to generalized weakness. Reports a voracious appetite.   Assessment & Plan:  EtOH abuse Recently cut back to 5-6 beers/day from prior 24 beers/day - high risk for withdrawal - cont to follow in SDU - stable for now w/ improving mental status   Profound microcytic anemia  S/p 5U total PRBC - Hgb has improved overall but may be dropping again (v/s simply equilibrating) - thus far no evidence of actual blood loss, but ferritin is 6 - follow trend for now - will benefit from screening colo and possibly EGD regardless, but may be appropriate for outpt depending upon inpt course  Recent Labs  Lab 09/09/18 2322 09/10/18 0432 09/10/18 1038 09/10/18 1519 09/11/18 0205  HGB 5.6* 7.0* 8.5* 8.7* 8.1*    Hyponatremia  Likely beer drinkers potomania - slowly improving w/ volume expansion and avoidance of EtOH - follow   Recent Labs  Lab 09/09/18 1417 09/10/18 0432 09/11/18 0205  NA 129* 129* 131*   Severe Hypokalemia Mg is ok - replace and follow - likely due to alcoholism   B Pleural effusions - Mild LE edema Likely due to low oncotic pressure in setting of malnutrition of EtOH abuse - albumin 2.1 - refused TTE on first attempt - re-ordered   DVT prophylaxis: SCDs Code Status: FULL CODE Family Communication: no family present at time of exam  Disposition Plan: stable for SDU   Consultants:  PCCM  Antimicrobials:  11/7 vanc 11/7 cefepime    Objective: Blood pressure 104/62, pulse 74, temperature 98 F (36.7 C), temperature source Oral, resp. rate (!) 26, weight 83.1 kg, SpO2 97 %.  Intake/Output Summary (Last 24 hours) at 09/11/2018 1101 Last data filed at 09/11/2018 0900 Gross per 24 hour  Intake 595.02 ml  Output 3150 ml  Net -2554.98 ml   Filed Weights   09/09/18 1900 09/10/18 0500 09/11/18 0409  Weight: 82.9 kg 86.2 kg 83.1 kg    Examination: General: No acute respiratory distress - alert  Lungs: CTA B - no wheezing  Cardiovascular: RRR - no M or rub  Abdomen: NT/ND, soft, bs+, no mass  Extremities: trace B LE edema   CBC: Recent Labs  Lab 09/09/18 1430  09/10/18 1038 09/10/18 1519 09/11/18 0205  WBC 5.6   < > 8.7 8.0 7.3  NEUTROABS 3.2  --   --   --   --   HGB 3.0*   < > 8.5* 8.7* 8.1*  HCT 12.1*   < > 28.3* 28.9* 26.1*  MCV 65.1*   < > 77.1* 77.7* 75.9*  PLT 246   < > 224 226 205   < > = values in this interval not displayed.   Basic Metabolic Panel: Recent Labs  Lab 09/09/18 1417 09/10/18 0432 09/11/18 0205  NA 129* 129* 131*  K 3.6 3.5 2.7*  CL 102 104 104  CO2 20* 20* 22  GLUCOSE 89 92 97  BUN 6* 7* 8  CREATININE 0.59* 0.65 0.67  CALCIUM 7.6* 7.3* 7.3*  MG  --  1.9  --   PHOS  --  3.2  --    GFR: CrCl cannot be calculated (Unknown ideal weight.).  Liver Function Tests: Recent Labs  Lab 09/09/18 1417 09/11/18 0205  AST 19 18  ALT 11 12  ALKPHOS 78 69  BILITOT 1.2 2.6*  PROT 6.7 6.2*  ALBUMIN 2.1* 1.8*    Coagulation Profile: Recent Labs  Lab 09/09/18 1417  INR 1.80    Recent Results (from the past 240 hour(s))  Blood culture (routine x 2)     Status: None (Preliminary result)   Collection Time: 09/09/18  2:49 PM  Result Value Ref Range Status   Specimen Description BLOOD LEFT ARM  Final   Special Requests   Final    BOTTLES DRAWN AEROBIC AND ANAEROBIC Blood Culture results may not be optimal due to an inadequate volume of blood received in culture bottles    Culture   Final    NO GROWTH < 24 HOURS Performed at [facility] [address] [place], [place] [address]    Report Status PENDING  Incomplete  Blood culture (routine x 2)     Status: None (Preliminary result)   Collection Time: 09/09/18  2:50 PM  Result Value Ref Range Status   Specimen Description BLOOD RIGHT ARM  Final   Special Requests   Final    BOTTLES DRAWN AEROBIC AND ANAEROBIC Blood Culture adequate volume   Culture   Final    NO GROWTH < 24 HOURS Performed at [facility], [place] [address] [place], [place] [address]    Report Status PENDING  Incomplete  MRSA PCR Screening     Status: None   Collection Time: 09/09/18  7:58 PM  Result Value Ref Range Status   MRSA by PCR NEGATIVE NEGATIVE Final    Comment:        The GeneXpert MRSA Assay (FDA approved for NASAL specimens only), is one component of a comprehensive MRSA colonization surveillance program. It is not intended to diagnose MRSA infection nor to guide or monitor treatment for MRSA infections. Performed at [facility], [place] [address] [place], [place] [address]      Scheduled Meds: . folic acid  1 mg Oral Daily  . pantoprazole  40 mg Oral Q1200  . thiamine  100 mg Oral Daily     LOS: 2 days   Cherene Altes, MD [facility] Office  [phone] Pager - Text Page per Amion  If 7PM-7AM, please contact night-coverage per Amion 09/11/2018, 11:01 AM

## 2018-09-11 NOTE — Progress Notes (Signed)
Attempted: patient eating lunch and wanted to postpone echo.  Nurse notified.  Will attempt at a later time.

## 2018-09-12 DIAGNOSIS — D649 Anemia, unspecified: Secondary | ICD-10-CM | POA: Diagnosis present

## 2018-09-12 DIAGNOSIS — F1010 Alcohol abuse, uncomplicated: Secondary | ICD-10-CM

## 2018-09-12 DIAGNOSIS — E876 Hypokalemia: Principal | ICD-10-CM

## 2018-09-12 LAB — CBC
HEMATOCRIT: 26.8 % — AB (ref 39.0–52.0)
Hemoglobin: 8.3 g/dL — ABNORMAL LOW (ref 13.0–17.0)
MCH: 23.7 pg — AB (ref 26.0–34.0)
MCHC: 31 g/dL (ref 30.0–36.0)
MCV: 76.6 fL — ABNORMAL LOW (ref 80.0–100.0)
Platelets: 212 10*3/uL (ref 150–400)
RBC: 3.5 MIL/uL — ABNORMAL LOW (ref 4.22–5.81)
RDW: 27 % — ABNORMAL HIGH (ref 11.5–15.5)
WBC: 7.3 10*3/uL (ref 4.0–10.5)
nRBC: 0 % (ref 0.0–0.2)

## 2018-09-12 LAB — COMPREHENSIVE METABOLIC PANEL
ALT: 11 U/L (ref 0–44)
ANION GAP: 3 — AB (ref 5–15)
AST: 18 U/L (ref 15–41)
Albumin: 1.7 g/dL — ABNORMAL LOW (ref 3.5–5.0)
Alkaline Phosphatase: 66 U/L (ref 38–126)
BILIRUBIN TOTAL: 1.7 mg/dL — AB (ref 0.3–1.2)
BUN: 5 mg/dL — ABNORMAL LOW (ref 8–23)
CALCIUM: 7.2 mg/dL — AB (ref 8.9–10.3)
CO2: 21 mmol/L — ABNORMAL LOW (ref 22–32)
Chloride: 103 mmol/L (ref 98–111)
Creatinine, Ser: 0.52 mg/dL — ABNORMAL LOW (ref 0.61–1.24)
GFR calc non Af Amer: >60 mL/min (ref >60)
Glucose, Bld: 91 mg/dL (ref 70–99)
Potassium: 3.8 mmol/L (ref 3.5–5.1)
Sodium: 127 mmol/L — ABNORMAL LOW (ref 135–145)
TOTAL PROTEIN: 5.9 g/dL — AB (ref 6.5–8.1)

## 2018-09-12 LAB — FERRITIN: FERRITIN: 33 ng/mL (ref 24–336)

## 2018-09-12 LAB — MAGNESIUM: MAGNESIUM: 1.7 mg/dL (ref 1.7–2.4)

## 2018-09-12 LAB — PHOSPHORUS: PHOSPHORUS: 1.8 mg/dL — AB (ref 2.5–4.6)

## 2018-09-12 NOTE — Discharge Instructions (Signed)
IT IS VERY IMPORTANT THAT YOU KEEP THE APPOINTMENTS THAT HAVE BEEN ARRANGED FOR YOU (LISTED ON THIS PAPERWORK)  YOU NEED TO HAVE YOUR BLOOD COUNT CHECKED, AND YOU ALSO NEED TO BE SET UP FOR A COLONOSCOPY  YOU MUST QUIT DRINKING ALCOHOL  YOU DO NOT HAVE HEART FAILURE (despite the fact that it is listed above on this paper in error)   Anemia Anemia is a condition in which you do not have enough red blood cells or hemoglobin. Hemoglobin is a substance in red blood cells that carries oxygen. When you do not have enough red blood cells or hemoglobin (are anemic), your body cannot get enough oxygen and your organs may not work properly. As a result, you may feel very tired or have other problems. What are the causes? Common causes of anemia include:  Excessive bleeding. Anemia can be caused by excessive bleeding inside or outside the body, including bleeding from the intestine or from periods in women.  Poor nutrition.  Long-lasting (chronic) kidney, thyroid, and liver disease.  Bone marrow disorders.  Cancer and treatments for cancer.  HIV (human immunodeficiency virus) and AIDS (acquired immunodeficiency syndrome).  Treatments for HIV and AIDS.  Spleen problems.  Blood disorders.  Infections, medicines, and autoimmune disorders that destroy red blood cells.  What are the signs or symptoms? Symptoms of this condition include:  Minor weakness.  Dizziness.  Headache.  Feeling heartbeats that are irregular or faster than normal (palpitations).  Shortness of breath, especially with exercise.  Paleness.  Cold sensitivity.  Indigestion.  Nausea.  Difficulty sleeping.  Difficulty concentrating.  Symptoms may occur suddenly or develop slowly. If your anemia is mild, you may not have symptoms. How is this diagnosed? This condition is diagnosed based on:  Blood tests.  Your medical history.  A physical exam.  Bone marrow biopsy.  Your health care provider may  also check your stool (feces) for blood and may do additional testing to look for the cause of your bleeding. You may also have other tests, including:  Imaging tests, such as a CT scan or MRI.  Endoscopy.  Colonoscopy.  How is this treated? Treatment for this condition depends on the cause. If you continue to lose a lot of blood, you may need to be treated at a hospital. Treatment may include:  Taking supplements of iron, vitamin Z30, or folic acid.  Taking a hormone medicine (erythropoietin) that can help to stimulate red blood cell growth.  Having a blood transfusion. This may be needed if you lose a lot of blood.  Making changes to your diet.  Having surgery to remove your spleen.  Follow these instructions at home:  Take over-the-counter and prescription medicines only as told by your health care provider.  Take supplements only as told by your health care provider.  Follow any diet instructions that you were given.  Keep all follow-up visits as told by your health care provider. This is important. Contact a health care provider if:  You develop new bleeding anywhere in the body. Get help right away if:  You are very weak.  You are short of breath.  You have pain in your abdomen or chest.  You are dizzy or feel faint.  You have trouble concentrating.  You have bloody or black, tarry stools.  You vomit repeatedly or you vomit up blood. Summary  Anemia is a condition in which you do not have enough red blood cells or enough of a substance in your red  blood cells that carries oxygen (hemoglobin).  Symptoms may occur suddenly or develop slowly.  If your anemia is mild, you may not have symptoms.  This condition is diagnosed with blood tests as well as a medical history and physical exam. Other tests may be needed.  Treatment for this condition depends on the cause of the anemia. This information is not intended to replace advice given to you by your health  care provider. Make sure you discuss any questions you have with your health care provider. Document Released: 11/27/2004 Document Revised: 11/21/2016 Document Reviewed: 11/21/2016 Elsevier Interactive Patient Education  2018 [employer].   Alcohol Use Disorder Alcohol use disorder is when your drinking disrupts your daily life. When you have this condition, you drink too much alcohol and you cannot control your drinking. Alcohol use disorder can cause serious problems with your physical health. It can affect your brain, heart, liver, pancreas, immune system, stomach, and intestines. Alcohol use disorder can increase your risk for certain cancers and cause problems with your mental health, such as depression, anxiety, psychosis, delirium, and dementia. People with this disorder risk hurting themselves and others. What are the causes? This condition is caused by drinking too much alcohol over time. It is not caused by drinking too much alcohol only one or two times. Some people with this condition drink alcohol to cope with or escape from negative life events. Others drink to relieve pain or symptoms of mental illness. What increases the risk? You are more likely to develop this condition if:  You have a family history of alcohol use disorder.  Your culture encourages drinking to the point of intoxication, or makes alcohol easy to get.  You had a mood or conduct disorder in childhood.  You have been a victim of abuse.  You are an adolescent and: ? You have poor grades or difficulties in school. ? Your caregivers do not talk to you about saying no to alcohol, or supervise your activities. ? You are impulsive or you have trouble with self-control.  What are the signs or symptoms? Symptoms of this condition include:  Drinkingmore than you want to.  Drinking for longer than you want to.  Trying several times to drink less or to control your drinking.  Spending a lot of time getting  alcohol, drinking, or recovering from drinking.  Craving alcohol.  Having problems at work, at school, or at home due to drinking.  Having problems in relationships due to drinking.  Drinking when it is dangerous to drink, such as before driving a car.  Continuing to drink even though you know you might have a physical or mental problem related to drinking.  Needing more and more alcohol to get the same effect you want from the alcohol (building up tolerance).  Having symptoms of withdrawal when you stop drinking. Symptoms of withdrawal include: ? Fatigue. ? Nightmares. ? Trouble sleeping. ? Depression. ? Anxiety. ? Fever. ? Seizures. ? Severe confusion. ? Feeling or seeing things that are not there (hallucinations). ? Tremors. ? Rapid heart rate. ? Rapid breathing. ? High blood pressure.  Drinking to avoid symptoms of withdrawal.  How is this diagnosed? This condition is diagnosed with an assessment. Your health care provider may start the assessment by asking three or four questions about your drinking. Your health care provider may perform a physical exam or do lab tests to see if you have physical problems resulting from alcohol use. She or he may refer you to a  mental health professional for evaluation. How is this treated? Some people with alcohol use disorder are able to reduce their alcohol use to low-risk levels. Others need to completely quit drinking alcohol. When necessary, mental health professionals with specialized training in substance use treatment can help. Your health care provider can help you decide how severe your alcohol use disorder is and what type of treatment you need. The following forms of treatment are available:  Detoxification. Detoxification involves quitting drinking and using prescription medicines within the first week to help lessen withdrawal symptoms. This treatment is important for people who have had withdrawal symptoms before and for  heavy drinkers who are likely to have withdrawal symptoms. Alcohol withdrawal can be dangerous, and in severe cases, it can cause death. Detoxification may be provided in a home, community, or primary care setting, or in a hospital or substance use treatment facility.  Counseling. This treatment is also called talk therapy. It is provided by substance use treatment counselors. A counselor can address the reasons you use alcohol and suggest ways to keep you from drinking again or to prevent problem drinking. The goals of talk therapy are to: ? Find healthy activities and ways for you to cope with stress. ? Identify and avoid the things that trigger your alcohol use. ? Help you learn how to handle cravings.  Medicines.Medicines can help treat alcohol use disorder by: ? Decreasing alcohol cravings. ? Decreasing the positive feeling you have when you drink alcohol. ? Causing an uncomfortable physical reaction when you drink alcohol (aversion therapy).  Support groups. Support groups are led by people who have quit drinking. They provide emotional support, advice, and guidance.  These forms of treatment are often combined. Some people with this condition benefit from a combination of treatments provided by specialized substance use treatment centers. Follow these instructions at home:  Take over-the-counter and prescription medicines only as told by your health care provider.  Check with your health care provider before starting any new medicines.  Ask friends and family members not to offer you alcohol.  Avoid situations where alcohol is served, including gatherings where others are drinking alcohol.  Create a plan for what to do when you are tempted to use alcohol.  Find hobbies or activities that you enjoy that do not include alcohol.  Keep all follow-up visits as told by your health care provider. This is important. How is this prevented?  If you drink, limit alcohol intake to no more  than 1 drink a day for nonpregnant women and 2 drinks a day for men. One drink equals 12 oz of beer, 5 oz of wine, or 1 oz of hard liquor.  If you have a mental health condition, get treatment and support.  Do not give alcohol to adolescents.  If you are an adolescent: ? Do not drink alcohol. ? Do not be afraid to say no if someone offers you alcohol. Speak up about why you do not want to drink. You can be a positive role model for your friends and set a good example for those around you by not drinking alcohol. ? If your friends drink, spend time with others who do not drink alcohol. Make new friends who do not use alcohol. ? Find healthy ways to manage stress and emotions, such as meditation or deep breathing, exercise, spending time in nature, listening to music, or talking with a trusted friend or family member. Contact a health care provider if:  You are not able to  take your medicines as told.  Your symptoms get worse.  You return to drinking alcohol (relapse) and your symptoms get worse. Get help right away if:  You have thoughts about hurting yourself or others. If you ever feel like you may hurt yourself or others, or have thoughts about taking your own life, get help right away. You can go to your nearest emergency department or call:  Your local emergency services (911 in the [place]  A suicide crisis helpline, such as the [facility] at (918) 021-0826. This is open 24 hours a day.  Summary  Alcohol use disorder is when your drinking disrupts your daily life. When you have this condition, you drink too much alcohol and you cannot control your drinking.  Treatment may include detoxification, counseling, medicine, and support groups.  Ask friends and family members not to offer you alcohol. Avoid situations where alcohol is served.  Get help right away if you have thoughts about hurting yourself or others. This information is not intended to  replace advice given to you by your health care provider. Make sure you discuss any questions you have with your health care provider. Document Released: 11/27/2004 Document Revised: 07/17/2016 Document Reviewed: 07/17/2016 Elsevier Interactive Patient Education  [employer].

## 2018-09-12 NOTE — Plan of Care (Signed)

## 2018-09-12 NOTE — Progress Notes (Signed)
Discharge planning including follow up appointments have been reviewed with the patient by RN. Patient verbalized understanding of discharge instruction. Son Izell [place]) here to take pt home.

## 2018-09-12 NOTE — Discharge Summary (Signed)
DISCHARGE SUMMARY  Gregory Rowe  MR#: [id number]  DOB:[birth year]  Date of Admission: 09/09/2018 Date of Discharge: 09/12/2018  Attending Physician:Genieve Ramaswamy Hennie Duos, MD  Patient's JKK:[id number], No Pcp Per  Consults: none   Disposition: d/c home   Follow-up Appts: Follow-up Information    [facility]. Go on 09/15/2018.   Why:  at 8:40am for your hospital follow-up appointment Contact information: [address] [place] [address]       [facility] [place]. Go on 09/20/2018.   Why:  at 1:40pm for your new patient appointment. Contact information: [address] [place] [address] [facility] Follow up.   Why:  for your prescription needs Mon-Fri for discounted medications $4-$10. Contact information: [place] [phone]          Tests Needing Follow-up: -recheck Hgb -assess extent of ongoing EtOH abuse -schedule patient for outpatient screening colonoscopy  Discharge Diagnoses: EtOH abuse Profound microcytic anemia  Hyponatremia  Severe Hypokalemia B Pleural effusions  Initial presentation: 62 yo M with heavy ETOH use - last drink 11/7, no known significant history but hasnt seen provider in years presenting with severe progressive weakness, SOB, and BLE swelling. In the ED he was found to have a Hgb of 3 with hypotension.   Hospital Course:  EtOH abuse Recently cut back to 5-6 beers/day from prior 24 beers/day - no withdrawal during stay, but it was revealed his son was sneaking him beer every night while he was admitted - counseled pt on damage to his body from ongoing alcohol use, and link to his life threatening anemia - he voiced understanding, but made it clear to me he has no intention of stopping drinking - he has agreed to "cut back  more"    Profound microcytic anemia  S/p 5U total PRBC this admit - Hgb improved and then equilibrated at ~8 - no evidence of actual blood loss during admit (guaic neg in ED), but initial ferritin was 6 - will benefit from screening colo and possibly EGD regardless, but is stable enough to be appropriate for outpt - absolute need for colo and monitoring of Hgb impressed upon patient - risk of death with failure to do so explained to patient   Hyponatremia  Likely beer drinkers potomania - Na stable during hospital stay between 127 > 130 - neurologically intact at time of d/c   Severe Hypokalemia Mg is ok - replaced - likely due to alcoholism   B Pleural effusions - Mild LE edema due to low oncotic pressure in setting of malnutrition of EtOH abuse and profound anemia - albumin 2.1 - refused TTE on first attempt - re-ordered and it confirmed preserved systolic and diastolic fxn w/ no CHF or valve abnormalities  Allergies as of 09/12/2018      Reactions   Other Other (See Comments)   NARCOTICS; makes him angry      Medication List    You have not been prescribed any medications.     Day of Discharge BP 114/69 (BP Location: Right Arm)   Pulse 88   Temp 98.3 F (36.8 C) (Oral)   Resp (!) 21   Wt 83.1 kg   SpO2 100%   Physical Exam: General: No acute respiratory distress Lungs: Clear to auscultation bilaterally without wheezes or crackles Cardiovascular: Regular rate  and rhythm without murmur gallop or rub normal S1 and S2 Abdomen: Nontender, nondistended, soft, bowel sounds positive, no rebound, no ascites, no appreciable mass Extremities: No significant cyanosis, clubbing, or edema bilateral lower extremities  Basic Metabolic Panel: Recent Labs  Lab 09/09/18 1417 09/10/18 0432 09/11/18 0205 09/12/18 0301  NA 129* 129* 131* 127*  K 3.6 3.5 2.7* 3.8  CL 102 104 104 103  CO2 20* 20* 22 21*  GLUCOSE 89 92 97 91  BUN 6* 7* 8 5*  CREATININE 0.59* 0.65 0.67 0.52*    CALCIUM 7.6* 7.3* 7.3* 7.2*  MG  --  1.9  --  1.7  PHOS  --  3.2  --  1.8*    Liver Function Tests: Recent Labs  Lab 09/09/18 1417 09/11/18 0205 09/12/18 0301  AST 19 18 18   ALT 11 12 11   ALKPHOS 78 69 66  BILITOT 1.2 2.6* 1.7*  PROT 6.7 6.2* 5.9*  ALBUMIN 2.1* 1.8* 1.7*    Coags: Recent Labs  Lab 09/09/18 1417  INR 1.80    CBC: Recent Labs  Lab 09/09/18 1430  09/10/18 0432 09/10/18 1038 09/10/18 1519 09/11/18 0205 09/12/18 0301  WBC 5.6   < > 9.6 8.7 8.0 7.3 7.3  NEUTROABS 3.2  --   --   --   --   --   --   HGB 3.0*   < > 7.0* 8.5* 8.7* 8.1* 8.3*  HCT 12.1*   < > 23.8* 28.3* 28.9* 26.1* 26.8*  MCV 65.1*   < > 76.8* 77.1* 77.7* 75.9* 76.6*  PLT 246   < > 229 224 226 205 212   < > = values in this interval not displayed.    Recent Results (from the past 240 hour(s))  Blood culture (routine x 2)     Status: None (Preliminary result)   Collection Time: 09/09/18  2:49 PM  Result Value Ref Range Status   Specimen Description BLOOD LEFT ARM  Final   Special Requests   Final    BOTTLES DRAWN AEROBIC AND ANAEROBIC Blood Culture results may not be optimal due to an inadequate volume of blood received in culture bottles   Culture   Final    NO GROWTH 3 DAYS Performed at [facility], [place] [address], [place], [place] [address]    Report Status PENDING  Incomplete  Blood culture (routine x 2)     Status: None (Preliminary result)   Collection Time: 09/09/18  2:50 PM  Result Value Ref Range Status   Specimen Description BLOOD RIGHT ARM  Final   Special Requests   Final    BOTTLES DRAWN AEROBIC AND ANAEROBIC Blood Culture adequate volume   Culture   Final    NO GROWTH 3 DAYS Performed at [facility], [address], [place], [place] [address]    Report Status PENDING  Incomplete  MRSA PCR Screening     Status: None   Collection Time: 09/09/18  7:58 PM  Result Value Ref Range Status   MRSA by PCR NEGATIVE NEGATIVE Final    Comment:        The  GeneXpert MRSA Assay (FDA approved for NASAL specimens only), is one component of a comprehensive MRSA colonization surveillance program. It is not intended to diagnose MRSA infection nor to guide or monitor treatment for MRSA infections. Performed at [facility], [place] [address] [place], [place] [address]      Time spent in discharge (  includes decision making & examination of pt): 35 minutes  09/12/2018, 11:29 AM   Cherene Altes, MD [facility] Office  [phone] Pager [phone]  On-Call/Text Page:      [email or web address]      password [facility]

## 2018-09-13 LAB — VITAMIN B1: VITAMIN B1 (THIAMINE): 101.4 nmol/L (ref 66.5–200.0)

## 2018-09-14 LAB — CULTURE, BLOOD (ROUTINE X 2)
CULTURE: NO GROWTH
Culture: NO GROWTH
SPECIAL REQUESTS: ADEQUATE

## 2018-09-15 ENCOUNTER — Encounter: Payer: Self-pay | Admitting: Medical

## 2018-09-15 ENCOUNTER — Inpatient Hospital Stay (HOSPITAL_BASED_OUTPATIENT_CLINIC_OR_DEPARTMENT_OTHER)
Admission: EM | Admit: 2018-09-15 | Discharge: 2018-09-24 | DRG: 186 | Disposition: A | Discharge status: Home / Self Care | Payer: Medicaid Other | Attending: Internal Medicine | Admitting: Internal Medicine

## 2018-09-15 ENCOUNTER — Encounter (HOSPITAL_BASED_OUTPATIENT_CLINIC_OR_DEPARTMENT_OTHER): Payer: Self-pay | Admitting: Emergency Medicine

## 2018-09-15 ENCOUNTER — Ambulatory Visit (HOSPITAL_BASED_OUTPATIENT_CLINIC_OR_DEPARTMENT_OTHER)
Admission: RE | Admit: 2018-09-15 | Discharge: 2018-09-15 | Disposition: A | Discharge status: Home / Self Care | Payer: Medicaid Other | Source: Ambulatory Visit | Attending: Medical | Admitting: Medical

## 2018-09-15 ENCOUNTER — Other Ambulatory Visit: Payer: Self-pay

## 2018-09-15 ENCOUNTER — Ambulatory Visit (INDEPENDENT_AMBULATORY_CARE_PROVIDER_SITE_OTHER): Payer: Self-pay | Admitting: Medical

## 2018-09-15 VITALS — BP 120/72 | HR 92 | Temp 93.0°F | Resp 16 | Ht 66.0 in

## 2018-09-15 DIAGNOSIS — F1010 Alcohol abuse, uncomplicated: Secondary | ICD-10-CM | POA: Diagnosis present

## 2018-09-15 DIAGNOSIS — E43 Unspecified severe protein-calorie malnutrition: Secondary | ICD-10-CM | POA: Diagnosis present

## 2018-09-15 DIAGNOSIS — J181 Lobar pneumonia, unspecified organism: Secondary | ICD-10-CM

## 2018-09-15 DIAGNOSIS — J90 Pleural effusion, not elsewhere classified: Secondary | ICD-10-CM

## 2018-09-15 DIAGNOSIS — R32 Unspecified urinary incontinence: Secondary | ICD-10-CM | POA: Diagnosis present

## 2018-09-15 DIAGNOSIS — R600 Localized edema: Secondary | ICD-10-CM

## 2018-09-15 DIAGNOSIS — C227 Other specified carcinomas of liver: Secondary | ICD-10-CM | POA: Diagnosis present

## 2018-09-15 DIAGNOSIS — Z885 Allergy status to narcotic agent status: Secondary | ICD-10-CM

## 2018-09-15 DIAGNOSIS — F10129 Alcohol abuse with intoxication, unspecified: Secondary | ICD-10-CM | POA: Diagnosis present

## 2018-09-15 DIAGNOSIS — E871 Hypo-osmolality and hyponatremia: Secondary | ICD-10-CM

## 2018-09-15 DIAGNOSIS — Z720 Tobacco use: Secondary | ICD-10-CM

## 2018-09-15 DIAGNOSIS — Z9181 History of falling: Secondary | ICD-10-CM

## 2018-09-15 DIAGNOSIS — D649 Anemia, unspecified: Secondary | ICD-10-CM

## 2018-09-15 DIAGNOSIS — B49 Unspecified mycosis: Secondary | ICD-10-CM

## 2018-09-15 DIAGNOSIS — Z9889 Other specified postprocedural states: Secondary | ICD-10-CM

## 2018-09-15 DIAGNOSIS — M7989 Other specified soft tissue disorders: Secondary | ICD-10-CM

## 2018-09-15 DIAGNOSIS — D638 Anemia in other chronic diseases classified elsewhere: Secondary | ICD-10-CM | POA: Diagnosis present

## 2018-09-15 DIAGNOSIS — S2241XA Multiple fractures of ribs, right side, initial encounter for closed fracture: Secondary | ICD-10-CM | POA: Diagnosis present

## 2018-09-15 DIAGNOSIS — J690 Pneumonitis due to inhalation of food and vomit: Secondary | ICD-10-CM | POA: Diagnosis present

## 2018-09-15 DIAGNOSIS — R970 Elevated carcinoembryonic antigen [CEA]: Secondary | ICD-10-CM | POA: Diagnosis present

## 2018-09-15 DIAGNOSIS — R0600 Dyspnea, unspecified: Secondary | ICD-10-CM | POA: Insufficient documentation

## 2018-09-15 DIAGNOSIS — R5383 Other fatigue: Secondary | ICD-10-CM

## 2018-09-15 DIAGNOSIS — E222 Syndrome of inappropriate secretion of antidiuretic hormone: Secondary | ICD-10-CM | POA: Diagnosis present

## 2018-09-15 DIAGNOSIS — Z6825 Body mass index (BMI) 25.0-25.9, adult: Secondary | ICD-10-CM

## 2018-09-15 DIAGNOSIS — Z79899 Other long term (current) drug therapy: Secondary | ICD-10-CM

## 2018-09-15 DIAGNOSIS — E162 Hypoglycemia, unspecified: Secondary | ICD-10-CM | POA: Diagnosis present

## 2018-09-15 DIAGNOSIS — K8020 Calculus of gallbladder without cholecystitis without obstruction: Secondary | ICD-10-CM | POA: Diagnosis present

## 2018-09-15 DIAGNOSIS — E876 Hypokalemia: Secondary | ICD-10-CM

## 2018-09-15 DIAGNOSIS — E46 Unspecified protein-calorie malnutrition: Secondary | ICD-10-CM | POA: Diagnosis present

## 2018-09-15 DIAGNOSIS — F1020 Alcohol dependence, uncomplicated: Secondary | ICD-10-CM

## 2018-09-15 DIAGNOSIS — C220 Liver cell carcinoma: Secondary | ICD-10-CM

## 2018-09-15 DIAGNOSIS — J189 Pneumonia, unspecified organism: Secondary | ICD-10-CM

## 2018-09-15 DIAGNOSIS — R601 Generalized edema: Secondary | ICD-10-CM | POA: Diagnosis present

## 2018-09-15 DIAGNOSIS — D509 Iron deficiency anemia, unspecified: Secondary | ICD-10-CM | POA: Diagnosis present

## 2018-09-15 DIAGNOSIS — I313 Pericardial effusion (noninflammatory): Secondary | ICD-10-CM | POA: Diagnosis present

## 2018-09-15 DIAGNOSIS — R160 Hepatomegaly, not elsewhere classified: Secondary | ICD-10-CM

## 2018-09-15 DIAGNOSIS — K921 Melena: Secondary | ICD-10-CM | POA: Diagnosis present

## 2018-09-15 LAB — GAMMA GT: GGT: 27 U/L (ref 7–51)

## 2018-09-15 LAB — COMPREHENSIVE METABOLIC PANEL
ALK PHOS: 84 U/L (ref 39–117)
ALT: 11 U/L (ref 0–53)
AST: 25 U/L (ref 0–37)
Albumin: 2.1 g/dL — ABNORMAL LOW (ref 3.5–5.2)
BUN: 4 mg/dL — AB (ref 6–23)
CHLORIDE: 93 meq/L — AB (ref 96–112)
CO2: 27 meq/L (ref 19–32)
Calcium: 7 mg/dL — ABNORMAL LOW (ref 8.4–10.5)
Creatinine, Ser: 0.4 mg/dL (ref 0.40–1.50)
GFR: 231.38 mL/min (ref >60.00)
GLUCOSE: 106 mg/dL — AB (ref 70–99)
POTASSIUM: 3.3 meq/L — AB (ref 3.5–5.1)
SODIUM: 122 meq/L — AB (ref 135–145)
Total Bilirubin: 2.1 mg/dL — ABNORMAL HIGH (ref 0.2–1.2)
Total Protein: 6.3 g/dL (ref 6.0–8.3)

## 2018-09-15 LAB — CBC WITH DIFFERENTIAL/PLATELET
Basophils Absolute: 0.1 10*3/uL (ref 0.0–0.1)
Basophils Relative: 1.6 % (ref 0.0–3.0)
Eosinophils Absolute: 0.1 10*3/uL (ref 0.0–0.7)
Eosinophils Relative: 1.6 % (ref 0.0–5.0)
HCT: 30.6 % — ABNORMAL LOW (ref 39.0–52.0)
Hemoglobin: 9.8 g/dL — ABNORMAL LOW (ref 13.0–17.0)
Lymphocytes Relative: 18.2 % (ref 12.0–46.0)
Lymphs Abs: 1.3 10*3/uL (ref 0.7–4.0)
MCHC: 32.1 g/dL (ref 30.0–36.0)
MCV: 78.1 fl (ref 78.0–100.0)
MONO ABS: 1.2 10*3/uL — AB (ref 0.1–1.0)
Monocytes Relative: 17 % — ABNORMAL HIGH (ref 3.0–12.0)
NEUTROS ABS: 4.3 10*3/uL (ref 1.4–7.7)
NEUTROS PCT: 61.6 % (ref 43.0–77.0)
PLATELETS: 207 10*3/uL (ref 150.0–400.0)
RBC: 3.92 Mil/uL — ABNORMAL LOW (ref 4.22–5.81)
RDW: 31.3 % — AB (ref 11.5–15.5)
WBC: 6.9 10*3/uL (ref 4.0–10.5)

## 2018-09-15 LAB — TROPONIN I: TNIDX: 0.02 ug/L (ref 0.00–0.06)

## 2018-09-15 LAB — MAGNESIUM: MAGNESIUM: 1.5 mg/dL (ref 1.5–2.5)

## 2018-09-15 LAB — BRAIN NATRIURETIC PEPTIDE: PRO B NATRI PEPTIDE: 776 pg/mL — AB (ref 0.0–100.0)

## 2018-09-15 LAB — VITAMIN D 25 HYDROXY (VIT D DEFICIENCY, FRACTURES)

## 2018-09-15 LAB — VITAMIN B12: Vitamin B-12: 888 pg/mL (ref 211–911)

## 2018-09-15 MED ORDER — NYSTATIN 100000 UNIT/GM EX CREA: 1.0000 "application " | TOPICAL_CREAM | Freq: Two times a day (BID) | CUTANEOUS | 1 refills | Status: DC

## 2018-09-15 NOTE — Progress Notes (Signed)
Subjective:    Patient ID: Gregory Rowe, male    DOB: February 26, 1956, 62 y.o.   MRN: [id number]  HPI  Pt in for for first time.  New pt visit and hospital follow up.   Pt states never got regular medical care before. Pt does not smoke. But second hand from girlfriend. 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  Pt was admitted and evaluated in ED then admitted. Recent dc on sunday. He has hx of anemia, alcoholism and low sodium.   Pt legs have been swollen as well. Pt had some sob on ambulation before he went to the hospital. Still mild sob but less than before.   Recent swelling of scrotum since he was discharged. But on exam he states not presently swollen. No testicle tenderness.   Pt has only drank 2 beers over last 4 days. Years ago drank 24 beers a day years ago. But over the years was cutting back.   Pt low k was also very low recently.   Xray of lungs showed pleural effusion. Echo done in hospital showed.Left ventricle: The cavity size was normal. Wall thickness was   increased in a pattern of mild LVH. Systolic function was normal.   The estimated ejection fraction was in the range of 55% to 60%.   Wall motion was normal; there were no regional wall motion   abnormalities. Left ventricular diastolic function parameters   were normal. - Aortic valve: Mildly calcified annulus. Trileaflet; mildly   thickened leaflets. Sclerosis without stenosis. - Mitral valve: Mildly calcified annulus. - Tricuspid valve: There was mild regurgitation. - Pericardium, extracardiac: A small pericardial effusion was   identified.   ekg recent showed sinus rhythm with pvc.    Review of Systems  Constitutional: Negative for chills, fatigue and fever.  HENT: Negative for congestion, sinus pressure and sinus pain.   Respiratory: Positive for shortness of breath. Negative for cough, choking, chest tightness, wheezing and stridor.   Cardiovascular: Negative for chest pain and  palpitations.  Gastrointestinal: Negative for abdominal distention, abdominal pain, blood in stool, diarrhea, nausea and vomiting.  Endocrine: Negative for polydipsia, polyphagia and polyuria.  Genitourinary:       See hpi.  Musculoskeletal: Negative for back pain, joint swelling and neck pain.       Pedal edema.  Skin: Negative for rash.  Neurological: Negative for dizziness, tremors, weakness, numbness and headaches.  Hematological: Negative for adenopathy. Does not bruise/bleed easily.  Psychiatric/Behavioral: Negative for behavioral problems, confusion, dysphoric mood and sleep disturbance. The patient is not nervous/anxious.     Past Medical History:  Diagnosis Date  . Alcoholism ([place])   . Anemia   . Hyponatremia      Social History   Socioeconomic History  . Marital status: Single    Spouse name: Not on file  . Number of children: Not on file  . Years of education: Not on file  . Highest education level: Not on file  Occupational History  . Not on file  Social Needs  . Financial resource strain: Not hard at all  . Food insecurity:    Worry: Never true    Inability: Never true  . Transportation needs:    Medical: Patient refused    Non-medical: Patient refused  Tobacco Use  . Smoking status: Never Smoker  . Smokeless tobacco: Current User    Types: Snuff  Substance and Sexual Activity  . Alcohol use: Yes    Alcohol/week: 4.0 -  5.0 standard drinks    Types: 4 - 5 Cans of beer per week  . Drug use: Never  . Sexual activity: Not Currently  Lifestyle  . Physical activity:    Days per week: Patient refused    Minutes per session: Patient refused  . Stress: Only a little  Relationships  . Social connections:    Talks on phone: Patient refused    Gets together: Patient refused    Attends religious service: Patient refused    Active member of club or organization: Patient refused    Attends meetings of clubs or organizations: Patient refused    Relationship  status: Patient refused  . Intimate partner violence:    Fear of current or ex partner: No    Emotionally abused: No    Physically abused: No    Forced sexual activity: No  Other Topics Concern  . Not on file  Social History Narrative   Patient lives with significant other.    No past surgical history on file.  No family history on file.  Allergies  Allergen Reactions  . Other Other (See Comments)    NARCOTICS; makes him angry    No current outpatient medications on file prior to visit.   No current facility-administered medications on file prior to visit.     BP 120/72   Pulse 92   Temp (!) 93 F (33.9 C)   Resp 16   Ht 5\' 6"  (1.676 m)   SpO2 93%   BMI 29.57 kg/m       Objective:   Physical Exam  General Mental Status- Alert. General Appearance- Not in acute distress.   Skin General: Color- Normal Color. Moisture- Normal Moisture.  Neck Carotid Arteries- Normal color. Moisture- Normal Moisture. No carotid bruits. No JVD.  Chest and Lung Exam Auscultation: Breath Sounds:-Normal.  Cardiovascular Auscultation:Rythm- Regular. Murmurs & Other Heart Sounds:Auscultation of the heart reveals- No Murmurs.  Abdomen Inspection:-Inspeection Normal. Palpation/Percussion:Note:No mass. Palpation and Percussion of the abdomen reveal- Non Tender, Non Distended + BS, no rebound or guarding.    Neurologic Cranial Nerve exam:- CN III-XII intact(No nystagmus), symmetric smile. Drift Test:- No drift. Romberg Exam:- Negative.  Heal to Toe Gait exam:-Normal. Finger to Nose:- Normal/Intact Strength:- 5/5 equal and symmetric strength both upper and lower extremities.  Genital- glands penis looks normal. Scrotum not obvious real swollen. Mild redness to scrotum. Wearing a diaper.(states occasional accident)  Lower ext- severe bilateral symmetric pedal edema. 2-3+. Negative homans signs.      Assessment & Plan:  [phone] son [name] number. Pt states [name] on  [id number]. You have had some moderate to severe anemia recently with transfusion in the hospital.  You improve clinically and was discharged.  On hospital summary they did recommend that you see gastroenterologist.  I did go ahead and place that referral and hopefully they will be able to see you within a week or 2.  Also will repeat your CBC to make sure your blood volume is stable.    For your recent hyponatremia, I am also repeating a metabolic panel.  Hopefully your sodium level is stable and not lower than it was previously.  Fatigue can be associated with both anemia and low sodium but will expand blood work today to include B12, B1 and vitamin D level.  In addition we will check thyroid.   You do have severe swelling bilaterally of both lower extremities.  We will get a chest x-ray to evaluate your recent pleural effusions.  Also will get a metabolic panel and bilateral lower extremity ultrasounds.  Your scrotum is slightly swollen on exam presently and you do have some mild redness.  Presently looks like probable fungal irritation and I prescribed nystatin.  Keep area dry and check for skin coloration changes.  If any skin color changes let us know.  If any severe pain or worse recurrent swelling then would get scrotal ultrasound.   For caution sake based on your complex medical history as well as recent dyspnea, I did add troponin to your blood work.  If you had some recent heart failure and demand ischemia troponin might come back positive.  We will follow labs and notify you of the results.  Keep in mind with recent hospitalization if you have any critical value then we might need to send you back to the emergency department.  Please continue to abstain from any alcohol use.  Will check your liver enzymes today and get a GGT lab study.  Follow-up this coming week or as needed.  45 minutes spent with pt. 50% of time counseled on diagnosis and work up for conditions to make sure stable  following recent discharge from hospital.  Mackie Pai, PA-C

## 2018-09-15 NOTE — ED Triage Notes (Signed)
Pt brought in tonight by his son  Son states pt had a follow up visit with his doctor today and tonight they called and told them his sodium level was low, had low potassium, and had elevated heart failure protein levels  Also pt was anemic and possibly has pneumonia   Pt has generalized weakness and is pale  Pt states he is short of breath upon exertion

## 2018-09-15 NOTE — Patient Instructions (Signed)
You have had some moderate to severe anemia recently with transfusion in the hospital.  You improve clinically and was discharged.  On hospital summary they did recommend that you see gastroenterologist.  I did go ahead and place that referral and hopefully they will be able to see you within a week or 2.  Also will repeat your CBC to make sure your blood volume is stable.    For your recent hyponatremia, I am also repeating a metabolic panel.  Hopefully your sodium level is stable and not lower than it was previously.  Fatigue can be associated with both anemia and low sodium but will expand blood work today to include B12, B1 and vitamin D level.  In addition we will check thyroid.   You do have severe swelling bilaterally of both lower extremities.  We will get a chest x-ray to evaluate your recent pleural effusions.  Also will get a metabolic panel and bilateral lower extremity ultrasounds.  Your scrotum is slightly swollen on exam presently and you do have some mild redness.  Presently looks like probable fungal irritation and I prescribed nystatin.  Keep area dry and check for skin coloration changes.  If any skin color changes let us know.  If any severe pain or worse recurrent swelling then would get scrotal ultrasound.  For caution sake based on your complex medical history as well as recent dyspnea, I did add troponin to your blood work.  If you had some recent heart failure and demand ischemia troponin might come back positive.  We will follow labs and notify you of the results.  Keep in mind with recent hospitalization if you have any critical value then we might need to send you back to the emergency department.  Please continue to abstain from any alcohol use.  Will check your liver enzymes today and get a GGT lab study.  Follow-up this coming week or as needed.

## 2018-09-16 ENCOUNTER — Emergency Department (HOSPITAL_BASED_OUTPATIENT_CLINIC_OR_DEPARTMENT_OTHER): Payer: Medicaid Other

## 2018-09-16 DIAGNOSIS — C227 Other specified carcinomas of liver: Secondary | ICD-10-CM | POA: Diagnosis not present

## 2018-09-16 DIAGNOSIS — R32 Unspecified urinary incontinence: Secondary | ICD-10-CM | POA: Diagnosis not present

## 2018-09-16 DIAGNOSIS — F10129 Alcohol abuse with intoxication, unspecified: Secondary | ICD-10-CM | POA: Diagnosis not present

## 2018-09-16 DIAGNOSIS — J690 Pneumonitis due to inhalation of food and vomit: Secondary | ICD-10-CM | POA: Diagnosis not present

## 2018-09-16 DIAGNOSIS — K8020 Calculus of gallbladder without cholecystitis without obstruction: Secondary | ICD-10-CM | POA: Diagnosis not present

## 2018-09-16 DIAGNOSIS — J90 Pleural effusion, not elsewhere classified: Secondary | ICD-10-CM | POA: Diagnosis present

## 2018-09-16 DIAGNOSIS — S2241XA Multiple fractures of ribs, right side, initial encounter for closed fracture: Secondary | ICD-10-CM | POA: Diagnosis not present

## 2018-09-16 DIAGNOSIS — Z720 Tobacco use: Secondary | ICD-10-CM | POA: Diagnosis not present

## 2018-09-16 DIAGNOSIS — E222 Syndrome of inappropriate secretion of antidiuretic hormone: Secondary | ICD-10-CM | POA: Diagnosis not present

## 2018-09-16 DIAGNOSIS — E876 Hypokalemia: Secondary | ICD-10-CM | POA: Diagnosis not present

## 2018-09-16 DIAGNOSIS — Z6825 Body mass index (BMI) 25.0-25.9, adult: Secondary | ICD-10-CM | POA: Diagnosis not present

## 2018-09-16 DIAGNOSIS — Z885 Allergy status to narcotic agent status: Secondary | ICD-10-CM | POA: Diagnosis not present

## 2018-09-16 DIAGNOSIS — E162 Hypoglycemia, unspecified: Secondary | ICD-10-CM | POA: Diagnosis not present

## 2018-09-16 DIAGNOSIS — E46 Unspecified protein-calorie malnutrition: Secondary | ICD-10-CM | POA: Diagnosis present

## 2018-09-16 DIAGNOSIS — R160 Hepatomegaly, not elsewhere classified: Secondary | ICD-10-CM | POA: Diagnosis not present

## 2018-09-16 DIAGNOSIS — E871 Hypo-osmolality and hyponatremia: Secondary | ICD-10-CM

## 2018-09-16 DIAGNOSIS — R601 Generalized edema: Secondary | ICD-10-CM | POA: Diagnosis present

## 2018-09-16 DIAGNOSIS — R970 Elevated carcinoembryonic antigen [CEA]: Secondary | ICD-10-CM | POA: Diagnosis not present

## 2018-09-16 DIAGNOSIS — Z9181 History of falling: Secondary | ICD-10-CM | POA: Diagnosis not present

## 2018-09-16 DIAGNOSIS — K921 Melena: Secondary | ICD-10-CM | POA: Diagnosis not present

## 2018-09-16 DIAGNOSIS — D509 Iron deficiency anemia, unspecified: Secondary | ICD-10-CM | POA: Diagnosis not present

## 2018-09-16 DIAGNOSIS — Z79899 Other long term (current) drug therapy: Secondary | ICD-10-CM | POA: Diagnosis not present

## 2018-09-16 DIAGNOSIS — I313 Pericardial effusion (noninflammatory): Secondary | ICD-10-CM | POA: Diagnosis not present

## 2018-09-16 DIAGNOSIS — F1010 Alcohol abuse, uncomplicated: Secondary | ICD-10-CM | POA: Diagnosis not present

## 2018-09-16 DIAGNOSIS — D638 Anemia in other chronic diseases classified elsewhere: Secondary | ICD-10-CM | POA: Diagnosis not present

## 2018-09-16 DIAGNOSIS — E43 Unspecified severe protein-calorie malnutrition: Secondary | ICD-10-CM | POA: Diagnosis not present

## 2018-09-16 DIAGNOSIS — R0609 Other forms of dyspnea: Secondary | ICD-10-CM | POA: Diagnosis present

## 2018-09-16 LAB — BASIC METABOLIC PANEL
Anion gap: 6 (ref 5–15)
Anion gap: 6 (ref 5–15)
BUN: <5 mg/dL — ABNORMAL LOW (ref 8–23)
BUN: <5 mg/dL — ABNORMAL LOW (ref 8–23)
CALCIUM: 6.5 mg/dL — AB (ref 8.9–10.3)
CO2: 22 mmol/L (ref 22–32)
CO2: 22 mmol/L (ref 22–32)
CREATININE: 0.36 mg/dL — AB (ref 0.61–1.24)
CREATININE: 0.41 mg/dL — AB (ref 0.61–1.24)
Calcium: 6.8 mg/dL — ABNORMAL LOW (ref 8.9–10.3)
Chloride: 94 mmol/L — ABNORMAL LOW (ref 98–111)
Chloride: 95 mmol/L — ABNORMAL LOW (ref 98–111)
GFR calc Af Amer: >60 mL/min (ref >60)
GFR calc non Af Amer: >60 mL/min (ref >60)
GLUCOSE: 75 mg/dL (ref 70–99)
Glucose, Bld: 82 mg/dL (ref 70–99)
Potassium: 2.8 mmol/L — ABNORMAL LOW (ref 3.5–5.1)
Potassium: 3.6 mmol/L (ref 3.5–5.1)
Sodium: 122 mmol/L — ABNORMAL LOW (ref 135–145)
Sodium: 123 mmol/L — ABNORMAL LOW (ref 135–145)

## 2018-09-16 LAB — PROCALCITONIN: Procalcitonin: 0.28 ng/mL

## 2018-09-16 MED ORDER — POTASSIUM CHLORIDE CRYS ER 20 MEQ PO TBCR
40.0000 meq | EXTENDED_RELEASE_TABLET | Freq: Once | ORAL | Status: AC
  Administered 2018-09-16: 40 meq via ORAL
  Filled 2018-09-16: qty 2

## 2018-09-16 MED ORDER — FOLIC ACID 1 MG PO TABS
1.0000 mg | ORAL_TABLET | Freq: Every day | ORAL | Status: DC
  Administered 2018-09-16 – 2018-09-23 (×7): 1 mg via ORAL
  Filled 2018-09-16 (×9): qty 1

## 2018-09-16 MED ORDER — FUROSEMIDE 10 MG/ML IJ SOLN
20.0000 mg | Freq: Two times a day (BID) | INTRAMUSCULAR | Status: DC
  Administered 2018-09-16 – 2018-09-24 (×17): 20 mg via INTRAVENOUS
  Filled 2018-09-16 (×17): qty 2

## 2018-09-16 MED ORDER — ACETAMINOPHEN 650 MG RE SUPP: 650.0000 mg | Freq: Four times a day (QID) | RECTAL | Status: DC | PRN

## 2018-09-16 MED ORDER — LORAZEPAM 2 MG/ML IJ SOLN
1.0000 mg | Freq: Four times a day (QID) | INTRAMUSCULAR | Status: AC | PRN
  Administered 2018-09-18: 1 mg via INTRAVENOUS
  Filled 2018-09-16: qty 1

## 2018-09-16 MED ORDER — THIAMINE HCL 100 MG/ML IJ SOLN: 100.0000 mg | Freq: Every day | INTRAMUSCULAR | Status: DC

## 2018-09-16 MED ORDER — ENSURE ENLIVE PO LIQD
237.0000 mL | Freq: Two times a day (BID) | ORAL | Status: DC
  Administered 2018-09-16 – 2018-09-23 (×8): 237 mL via ORAL

## 2018-09-16 MED ORDER — POTASSIUM CHLORIDE CRYS ER 20 MEQ PO TBCR
40.0000 meq | EXTENDED_RELEASE_TABLET | Freq: Three times a day (TID) | ORAL | Status: AC
  Administered 2018-09-16 (×2): 40 meq via ORAL
  Filled 2018-09-16 (×2): qty 2

## 2018-09-16 MED ORDER — VITAMIN B-1 100 MG PO TABS
100.0000 mg | ORAL_TABLET | Freq: Once | ORAL | Status: AC
  Administered 2018-09-16: 100 mg via ORAL
  Filled 2018-09-16: qty 1

## 2018-09-16 MED ORDER — ACETAMINOPHEN 325 MG PO TABS
650.0000 mg | ORAL_TABLET | Freq: Four times a day (QID) | ORAL | Status: DC | PRN
  Administered 2018-09-17 – 2018-09-21 (×6): 650 mg via ORAL
  Filled 2018-09-16 (×7): qty 2

## 2018-09-16 MED ORDER — VITAMIN B-1 100 MG PO TABS
100.0000 mg | ORAL_TABLET | Freq: Every day | ORAL | Status: DC
  Administered 2018-09-16 – 2018-09-23 (×7): 100 mg via ORAL
  Filled 2018-09-16 (×9): qty 1

## 2018-09-16 MED ORDER — SODIUM CHLORIDE 0.9 % IV BOLUS
1000.0000 mL | Freq: Once | INTRAVENOUS | Status: AC
  Administered 2018-09-16: 1000 mL via INTRAVENOUS

## 2018-09-16 MED ORDER — SODIUM CHLORIDE 0.9 % IV SOLN
Freq: Once | INTRAVENOUS | Status: AC
  Administered 2018-09-16: 05:00:00 via INTRAVENOUS

## 2018-09-16 MED ORDER — SODIUM CHLORIDE 0.9 % IV SOLN
1.0000 g | Freq: Once | INTRAVENOUS | Status: AC
  Administered 2018-09-16: 1 g via INTRAVENOUS
  Filled 2018-09-16: qty 10

## 2018-09-16 MED ORDER — ONDANSETRON HCL 4 MG PO TABS
4.0000 mg | ORAL_TABLET | Freq: Four times a day (QID) | ORAL | Status: DC | PRN
  Administered 2018-09-18: 4 mg via ORAL
  Filled 2018-09-16: qty 1

## 2018-09-16 MED ORDER — SODIUM CHLORIDE 0.9 % IV SOLN
500.0000 mg | Freq: Once | INTRAVENOUS | Status: AC
  Administered 2018-09-16: 500 mg via INTRAVENOUS
  Filled 2018-09-16: qty 500

## 2018-09-16 MED ORDER — AZITHROMYCIN 500 MG IV SOLR
INTRAVENOUS | Status: AC
  Filled 2018-09-16: qty 500

## 2018-09-16 MED ORDER — DEMECLOCYCLINE HCL 150 MG PO TABS
150.0000 mg | ORAL_TABLET | Freq: Two times a day (BID) | ORAL | Status: DC
  Administered 2018-09-16 – 2018-09-17 (×3): 150 mg via ORAL
  Filled 2018-09-16 (×3): qty 1

## 2018-09-16 MED ORDER — SODIUM CHLORIDE 0.9 % IV SOLN
INTRAVENOUS | Status: DC | PRN
  Administered 2018-09-16: 1000 mL via INTRAVENOUS
  Administered 2018-09-18: 250 mL via INTRAVENOUS

## 2018-09-16 MED ORDER — ALBUMIN HUMAN 5 % IV SOLN
12.5000 g | Freq: Once | INTRAVENOUS | Status: AC
  Administered 2018-09-16: 12.5 g via INTRAVENOUS
  Filled 2018-09-16: qty 250

## 2018-09-16 MED ORDER — POTASSIUM CHLORIDE 10 MEQ/100ML IV SOLN
10.0000 meq | Freq: Once | INTRAVENOUS | Status: AC
  Administered 2018-09-18: 10 meq via INTRAVENOUS
  Filled 2018-09-16 (×2): qty 100

## 2018-09-16 MED ORDER — ONDANSETRON HCL 4 MG/2ML IJ SOLN: 4.0000 mg | Freq: Four times a day (QID) | INTRAMUSCULAR | Status: DC | PRN

## 2018-09-16 MED ORDER — ADULT MULTIVITAMIN W/MINERALS CH
1.0000 | ORAL_TABLET | Freq: Every day | ORAL | Status: DC
  Administered 2018-09-16 – 2018-09-23 (×7): 1 via ORAL
  Filled 2018-09-16 (×9): qty 1

## 2018-09-16 MED ORDER — MAGNESIUM SULFATE 2 GM/50ML IV SOLN
2.0000 g | Freq: Once | INTRAVENOUS | Status: AC
  Administered 2018-09-16: 2 g via INTRAVENOUS
  Filled 2018-09-16: qty 50

## 2018-09-16 MED ORDER — LORAZEPAM 1 MG PO TABS: 1.0000 mg | ORAL_TABLET | Freq: Four times a day (QID) | ORAL | Status: AC | PRN

## 2018-09-16 NOTE — ED Notes (Signed)
See paper chart 

## 2018-09-16 NOTE — ED Notes (Signed)
Patient transported to CT 

## 2018-09-16 NOTE — ED Notes (Signed)
ED Provider at bedside. 

## 2018-09-16 NOTE — ED Notes (Signed)
Transported to [facility]

## 2018-09-16 NOTE — Progress Notes (Signed)
Pt alert and talking, son in earlier stayed about 30 minutes. Pt states having mild pain from "cracked" ribs due to previous fall. Pt has scrotal and lower leg edema, decreased breath sounds. Pt urine orange-yellow color on linen (inct) . Large BM noted. Will cont to monitor. SRP, RN

## 2018-09-16 NOTE — ED Provider Notes (Signed)
[facility] Provider Note  CSN: [id number] Arrival date & time: 09/15/18 2333  Chief Complaint(s) abnormal labs  HPI Gregory Rowe is a 62 y.o. male with a history of alcoholism who was recently admitted to the hospital for severe anemia with a hemoglobin of 3 requiring 5 units of packed red blood cells, hyponatremia related to beer potomania, found to have pleural effusions likely due to low oncotic pressures in the setting of malnutrition and alcohol use (reassuring echo during recent admission).  He presents today after post admission follow-up with a primary care provider who noted patient had worsening hyponatremia and new focal opacity on the chest x-ray.  Patient was discharged 3 days ago from the hospital.  Since then he reports being severely fatigued with dyspnea on exertion.  Patient endorses nonproductive cough.  Denies any fevers or chills.  Denies any headache, nausea, vomiting.  Denies any abdominal pain.  No diarrhea.  No chest pain.  Patient has difficulty getting around at home.   Reports that he has cut down significantly with his alcohol intake.  States that since being discharged he has only drink 2 beers.  Normally would drink 5-6 beers a day.  On review of records, labs from today's PCP visit are notable for stable hemoglobin of 9.8, no leukocytosis, hyponatremia at 122, hypokalemia at 3.3, hypocalcemia 7.0 with an albumin of 2.1 (which corrects to 8.5). Trop negative. BNP >700. CXR with new retrocardiac opacity concerning for PNA.  HPI  Past Medical History Past Medical History:  Diagnosis Date  . Alcoholism ([place])   . Anemia   . Hyponatremia    Patient Active Problem List   Diagnosis Date Noted  . Pneumonia 09/16/2018  . Symptomatic anemia 09/12/2018   Home Medication(s) Prior to Admission medications   Medication Sig Start Date End Date Taking? Authorizing Provider  nystatin cream (MYCOSTATIN) Apply 1 application topically 2  (two) times daily. 09/15/18   Saguier, Iris Pert                                                                                                                                    Past Surgical History History reviewed. No pertinent surgical history. Family History History reviewed. No pertinent family history.  Social History Social History   Tobacco Use  . Smoking status: Never Smoker  . Smokeless tobacco: Current User    Types: Snuff  Substance Use Topics  . Alcohol use: Yes    Alcohol/week: 4.0 - 5.0 standard drinks    Types: 4 - 5 Cans of beer per week  . Drug use: Never   Allergies Other  Review of Systems Review of Systems All other systems are reviewed and are negative for acute change except as noted in the HPI  Physical Exam Vital Signs  I have reviewed the triage vital signs BP 103/64 (BP Location: Right Arm)   Pulse (!) 102  Temp 98.8 F (37.1 C) (Oral)   Resp 14   SpO2 96%   Physical Exam  Constitutional: He is oriented to person, place, and time. He appears well-developed and well-nourished. No distress.  HENT:  Head: Normocephalic and atraumatic.  Nose: Nose normal.  Eyes: Pupils are equal, round, and reactive to light. Conjunctivae and EOM are normal. Right eye exhibits no discharge. Left eye exhibits no discharge. No scleral icterus.  Neck: Normal range of motion. Neck supple.  Cardiovascular: Normal rate and regular rhythm. Exam reveals no gallop and no friction rub.  No murmur heard. Pulmonary/Chest: Effort normal. No stridor. Tachypnea noted. He has decreased breath sounds in the left lower field. He has no rales.  Abdominal: Soft. He exhibits no distension. There is no tenderness.  Musculoskeletal: He exhibits no edema or tenderness.  Neurological: He is alert and oriented to person, place, and time.  Skin: Skin is warm and dry. No rash noted. He is not diaphoretic. No erythema. There is pallor.  Psychiatric: He has a normal mood and affect.   Vitals reviewed.   ED Results and Treatments Labs (all labs ordered are listed, but only abnormal results are displayed) Labs Reviewed  BASIC METABOLIC PANEL - Abnormal; Notable for the following components:      Result Value   Sodium 122 (*)    Potassium 2.8 (*)    Chloride 94 (*)    BUN <5 (*)    Creatinine, Ser 0.36 (*)    Calcium 6.5 (*)    All other components within normal limits                                                                                                                         EKG  EKG Interpretation  Date/Time:    Ventricular Rate:    PR Interval:    QRS Duration:   QT Interval:    QTC Calculation:   R Axis:     Text Interpretation:        Radiology Dg Chest 2 View  Result Date: 09/15/2018 CLINICAL DATA:  Recent hospitalization for transfusions secondary to anemia. History of fluid on the lungs. EXAM: CHEST - 2 VIEW COMPARISON:  Portable chest x-ray of September 09, 2018 FINDINGS: There is increased volume loss on the left with moderate sized left pleural effusion. Progressive atelectasis or pneumonia in the left lower lobe is present. On the right pleural fluid has filled the major fissure and some atelectasis medially persists. The cardiac silhouette is enlarged. The pulmonary vascularity is not clearly engorged. There is calcification in the wall of the aortic arch. There is calcification of the anterior longitudinal ligament of the thoracic spine. IMPRESSION: Increased density in the left retrocardiac region with moderate-sized left pleural effusion. Atelectasis or pneumonia here is suspected. Probable loculated fluid along the major fissure on the right medially. Chest CT scanning is recommended to better evaluate the pulmonary parenchyma and the pleural spaces. Electronically Signed   By: Shanon Brow  [place] M.D.   On: 09/15/2018 11:38   Ct Chest Wo Contrast  Result Date: 09/16/2018 CLINICAL DATA:  Acute onset of shortness of breath on exertion.  Hypokalemia and hyponatremia. Anemia. Right anterior rib pain. Assess left-sided pleural effusion. EXAM: CT CHEST WITHOUT CONTRAST TECHNIQUE: Multidetector CT imaging of the chest was performed following the standard protocol without IV contrast. COMPARISON:  Chest radiograph performed 09/15/2018 FINDINGS: Cardiovascular: The heart is mildly enlarged. Scattered coronary artery calcifications are seen. Mild calcification is noted at the aortic arch and proximal great vessels. Mediastinum/Nodes: A small pericardial effusion is noted. No mediastinal lymphadenopathy is seen. The mediastinum is difficult to fully characterize without contrast. The thyroid gland is grossly unremarkable. No axillary lymphadenopathy is appreciated. Lungs/Pleura: A small right-sided pleural effusion is noted, somewhat loculated in appearance. Fluid is seen tracking along the right major fissure. There is also a small to moderate left-sided pleural effusion, with consolidation of the left lower lobe. Pneumonia is a concern. Underlying mass cannot be excluded. Upper Abdomen: There is somewhat unusual enlargement of the caudate lobe of the liver to 7.3 cm, with associated heterogeneity. The visualized portions of the spleen are unremarkable. Stones are noted within the gallbladder; the gallbladder is largely contracted, with minimal nonspecific edema about the gallbladder. The visualized portions of the adrenal glands and kidneys are grossly unremarkable. Musculoskeletal: There is suggestion of minimally displaced fractures of the right anterior fourth through sixth ribs. There appears to be 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, incompletely imaged on this study. The visualized musculature is unremarkable in appearance. IMPRESSION: 1. Small right-sided pleural effusion, somewhat loculated in appearance. Fluid tracking along the right major fissure. Small to moderate left-sided pleural effusion, with consolidation of the left lower  lobe. Pneumonia is a concern. Underlying mass cannot be excluded, given the pleural effusion. 2. Suggestion of minimally displaced fractures of the right anterior fourth through sixth ribs. 3. Small pericardial effusion noted. 4. Somewhat unusual enlargement of the caudate lobe of the liver to 7.3 cm, with associated heterogeneity. Would correlate with LFTs, and consider dynamic liver protocol MRI or CT for further evaluation, when and as deemed clinically appropriate. 5. Cholelithiasis. Gallbladder largely contracted, with minimal nonspecific edema about the gallbladder. Electronically Signed   By: Garald Balding M.D.   On: 09/16/2018 01:29   US Venous Img Lower Bilateral  Result Date: 09/15/2018 CLINICAL DATA:  Bilateral lower extremity edema. History of recent hospitalization. Evaluate for DVT. EXAM: BILATERAL LOWER EXTREMITY VENOUS DOPPLER ULTRASOUND TECHNIQUE: Gray-scale sonography with graded compression, as well as color Doppler and duplex ultrasound were performed to evaluate the lower extremity deep venous systems from the level of the common femoral vein and including the common femoral, femoral, profunda femoral, popliteal and calf veins including the posterior tibial, peroneal and gastrocnemius veins when visible. The superficial great saphenous vein was also interrogated. Spectral Doppler was utilized to evaluate flow at rest and with distal augmentation maneuvers in the common femoral, femoral and popliteal veins. COMPARISON:  None. FINDINGS: RIGHT LOWER EXTREMITY Common Femoral Vein: No evidence of thrombus. Normal compressibility, respiratory phasicity and response to augmentation. Saphenofemoral Junction: No evidence of thrombus. Normal compressibility and flow on color Doppler imaging. Profunda Femoral Vein: No evidence of thrombus. Normal compressibility and flow on color Doppler imaging. Femoral Vein: No evidence of thrombus. Normal compressibility, respiratory phasicity and response to  augmentation. Popliteal Vein: No evidence of thrombus. Normal compressibility, respiratory phasicity and response to augmentation. Calf Veins: No evidence of thrombus. Normal compressibility  and flow on color Doppler imaging. Superficial Great Saphenous Vein: No evidence of thrombus. Normal compressibility. Venous Reflux:  None. Other Findings: There is a large amount of subcutaneous edema the level of the right lower leg and calf. Note is made of a serpiginous approximately 1.6 x 5.1 x 2.7 cm fluid collection within the right popliteal fossa favored to represent a Baker cyst. LEFT LOWER EXTREMITY Common Femoral Vein: No evidence of thrombus. Normal compressibility, respiratory phasicity and response to augmentation. Saphenofemoral Junction: No evidence of thrombus. Normal compressibility and flow on color Doppler imaging. Profunda Femoral Vein: No evidence of thrombus. Normal compressibility and flow on color Doppler imaging. Femoral Vein: No evidence of thrombus. Normal compressibility, respiratory phasicity and response to augmentation. Popliteal Vein: No evidence of thrombus. Normal compressibility, respiratory phasicity and response to augmentation. Calf Veins: No evidence of thrombus. Normal compressibility and flow on color Doppler imaging. Superficial Great Saphenous Vein: No evidence of thrombus. Normal compressibility. Venous Reflux:  None. Other Findings: There is a large amount of subcutaneous edema at the level the left lower leg and calf. IMPRESSION: No evidence of DVT within either lower extremity. Electronically Signed   By: Sandi Mariscal M.D.   On: 09/15/2018 11:44   Pertinent labs & imaging results that were available during my care of the patient were reviewed by me and considered in my medical decision making (see chart for details).  Medications Ordered in ED Medications  potassium chloride 10 mEq in 100 mL IVPB (has no administration in time range)  0.9 %  sodium chloride infusion (has no  administration in time range)  cefTRIAXone (ROCEPHIN) 1 g in sodium chloride 0.9 % 100 mL IVPB (1 g Intravenous New Bag/Given 09/16/18 0430)  azithromycin (ZITHROMAX) 500 mg in sodium chloride 0.9 % 250 mL IVPB (has no administration in time range)  azithromycin (ZITHROMAX) 500 MG injection (has no administration in time range)  0.9 %  sodium chloride infusion (1,000 mLs Intravenous New Bag/Given 09/16/18 0429)  sodium chloride 0.9 % bolus 1,000 mL (0 mLs Intravenous Stopped 09/16/18 0144)  thiamine (VITAMIN B-1) tablet 100 mg (100 mg Oral Given 09/16/18 0045)  potassium chloride SA (K-DUR,KLOR-CON) CR tablet 40 mEq (40 mEq Oral Given 09/16/18 0424)                                                                                                                                    Procedures Procedures  (including critical care time)  Medical Decision Making / ED Course I have reviewed the nursing notes for this encounter and the patient's prior records (if available in EHR or on provided paperwork).    Patient with worsening hyponatremia, hyperkalemia with new focal opacity on chest x-ray.  Patient is having generalized fatigue with cough and dyspnea on exertion.  Currently afebrile, tachypneic but satting well on room air, hemodynamically stable.  Metabolic panel was repeated and confirmed electrolyte derangements.  CT  of the chest was obtained which better characterized the opacity concerning for pneumonia.   Patient does not appear to be septic at this time.  He was treated with Rocephin and azithromycin empirically.  This will cover Legionella PNA (on DDx given the worsening Na).  Patient given small IV fluid bolus and placed on normal saline infusion.  Given IV and oral potassium for repletion.   Will discuss admission with medicine for continued work-up and management.  Final Clinical Impression(s) / ED Diagnoses Final diagnoses:  Hyponatremia  Hypokalemia  Pneumonia of left lower  lobe due to infectious organism ([place])  Fatigue, unspecified type      This chart was dictated using voice recognition software.  Despite best efforts to proofread,  errors can occur which can change the documentation meaning.   Fatima Blank, MD 09/16/18 [phone]

## 2018-09-16 NOTE — H&P (Signed)
History and Physical  Gregory Rowe XTG:[id number] DOB: 06/11/1956 DOA: 09/15/2018  Referring physician: Addison Lank, ER physician PCP: Patient, No Pcp Per  Outpatient Specialists: None Patient coming from: Home & is able to ambulate without assistance  Chief Complaint: Weakness and abnormal lab work  HPI: Gregory Rowe is a 62 y.o. male with medical history significant for recent hospitalization for severe anemia, not thought to be from bleeding post hospitalization was complicated by hyponatremia felt to be secondary to beer Potomania.  Patient was discharged on 11/10.  Since being home, he is still felt very weak.  He apparently told the ER physician when he came in today that he was also having a cough although patient himself says he does not recall that when I interviewed him.  He also had follow-up outpatient blood work done which noted a low sodium so he was sent to the emergency room where repeat blood work did confirm his sodium level which had been 127 on discharge and now at 122.  Other labs of note were a BNP of 776 and an albumin level of 2.1 (although this was improved from previous hospitalization.)  Patient had a chest x-ray which noted an increase in size of the left pleural effusion as well as appearances of trapped fluid, possibly loculated on the right side.  CT scan of the chest did confirm this.  Patient was given a liter of IV fluids in the emergency room the hospitalist were called for evaluation. Following admission, patient's potassium level went down to 2.8.  Review of Systems: Patient seen after arrival to floor. Pt complains of feeling weak and tired.  He combines of a mild nonproductive cough  Pt denies any headaches, vision changes, dysphagia, chest pain, palpitations, current shortness of breath, abdominal pain, hematuria, dysuria, constipation, diarrhea, focal extremity numbness weakness or pain.  Review of systems are otherwise negative   Past Medical  History:  Diagnosis Date  . Alcoholism ([place])   . Anemia   . Hyponatremia    History reviewed. No pertinent surgical history.  Social History:  reports that he has never smoked. His smokeless tobacco use includes snuff.  Patient used to drink a large number of beers daily.  He states that since his discharge, he is only drank 1 beer.Marland Kitchen He reports that he does not use drugs.  Lives alone with family who lives nearby.  Ambulates without assistance   Allergies  Allergen Reactions  . Other Other (See Comments)    NARCOTICS; makes him angry    Family history: Patient says he is not aware of any medical problems that run in his family.  Prior to Admission medications   Medication Sig Start Date End Date Taking? Authorizing Provider  Carboxymethylcellul-Glycerin (CLEAR EYES FOR DRY EYES) 1-0.25 % SOLN Apply 1-2 drops to eye 2 (two) times daily.   Yes [provider]  nystatin cream (MYCOSTATIN) Apply 1 application topically 2 (two) times daily. 09/15/18  Yes Saguier, Iris Pert    Physical Exam: BP 127/82 (BP Location: Left Arm)   Pulse (!) 110   Temp 98.7 F (37.1 C) (Oral)   Resp 18   SpO2 96%   General: Alert and oriented x3, irritable Eyes: Sclera nonicteric, extraocular movements are intact ENT: Normocephalic and atraumatic, mucous memories slightly dry Neck: Supple, no JVD Cardiovascular: Regular rate and rhythm, S1-S2 Respiratory: Decreased breath sounds on left side at the base Abdomen:'s soft, mild distention, nontender, hypoactive bowel sounds Skin: No skin breaks, tears or lesions  Musculoskeletal: 3+ pitting edema bilaterally going all the way up both legs extending into the scrotum as well Psychiatric: Appropriate, no evidence of psychoses Neurologic: No focal deficits          Labs on Admission:  Basic Metabolic Panel: Recent Labs  Lab 09/10/18 0432 09/11/18 0205 09/12/18 0301 09/15/18 1203 09/16/18 0203 09/16/18 1211  NA 129* 131* 127* 122*  122* 123*  K 3.5 2.7* 3.8 3.3* 2.8* 3.6  CL 104 104 103 93* 94* 95*  CO2 20* 22 21* 27 22 22   GLUCOSE 92 97 91 106* 82 75  BUN 7* 8 5* 4* <5* <5*  CREATININE 0.65 0.67 0.52* 0.40 0.36* 0.41*  CALCIUM 7.3* 7.3* 7.2* 7.0* 6.5* 6.8*  MG 1.9  --  1.7 1.5  --   --   PHOS 3.2  --  1.8*  --   --   --    Liver Function Tests: Recent Labs  Lab 09/11/18 0205 09/12/18 0301 09/15/18 1203  AST 18 18 25   ALT 12 11 11   ALKPHOS 69 66 84  BILITOT 2.6* 1.7* 2.1*  PROT 6.2* 5.9* 6.3  ALBUMIN 1.8* 1.7* 2.1*   No results for input(s): LIPASE, AMYLASE in the last 168 hours. No results for input(s): AMMONIA in the last 168 hours. CBC: Recent Labs  Lab 09/10/18 1038 09/10/18 1519 09/11/18 0205 09/12/18 0301 09/15/18 1203  WBC 8.7 8.0 7.3 7.3 6.9  NEUTROABS  --   --   --   --  4.3  HGB 8.5* 8.7* 8.1* 8.3* 9.8*  HCT 28.3* 28.9* 26.1* 26.8* 30.6*  MCV 77.1* 77.7* 75.9* 76.6* 78.1  PLT 224 226 205 212 207.0   Cardiac Enzymes: No results for input(s): CKTOTAL, CKMB, CKMBINDEX, TROPONINI in the last 168 hours.  BNP (last 3 results) No results for input(s): BNP in the last 8760 hours.  ProBNP (last 3 results) Recent Labs    09/15/18 1203  PROBNP 776.0*    CBG: No results for input(s): GLUCAP in the last 168 hours.  Radiological Exams on Admission: Dg Chest 2 View  Result Date: 09/15/2018 CLINICAL DATA:  Recent hospitalization for transfusions secondary to anemia. History of fluid on the lungs. EXAM: CHEST - 2 VIEW COMPARISON:  Portable chest x-ray of September 09, 2018 FINDINGS: There is increased volume loss on the left with moderate sized left pleural effusion. Progressive atelectasis or pneumonia in the left lower lobe is present. On the right pleural fluid has filled the major fissure and some atelectasis medially persists. The cardiac silhouette is enlarged. The pulmonary vascularity is not clearly engorged. There is calcification in the wall of the aortic arch. There is  calcification of the anterior longitudinal ligament of the thoracic spine. IMPRESSION: Increased density in the left retrocardiac region with moderate-sized left pleural effusion. Atelectasis or pneumonia here is suspected. Probable loculated fluid along the major fissure on the right medially. Chest CT scanning is recommended to better evaluate the pulmonary parenchyma and the pleural spaces. Electronically Signed   By: David  Martinique M.D.   On: 09/15/2018 11:38   Ct Chest Wo Contrast  Result Date: 09/16/2018 CLINICAL DATA:  Acute onset of shortness of breath on exertion. Hypokalemia and hyponatremia. Anemia. Right anterior rib pain. Assess left-sided pleural effusion. EXAM: CT CHEST WITHOUT CONTRAST TECHNIQUE: Multidetector CT imaging of the chest was performed following the standard protocol without IV contrast. COMPARISON:  Chest radiograph performed 09/15/2018 FINDINGS: Cardiovascular: The heart is mildly enlarged. Scattered coronary artery calcifications are seen.  Mild calcification is noted at the aortic arch and proximal great vessels. Mediastinum/Nodes: A small pericardial effusion is noted. No mediastinal lymphadenopathy is seen. The mediastinum is difficult to fully characterize without contrast. The thyroid gland is grossly unremarkable. No axillary lymphadenopathy is appreciated. Lungs/Pleura: A small right-sided pleural effusion is noted, somewhat loculated in appearance. Fluid is seen tracking along the right major fissure. There is also a small to moderate left-sided pleural effusion, with consolidation of the left lower lobe. Pneumonia is a concern. Underlying mass cannot be excluded. Upper Abdomen: There is somewhat unusual enlargement of the caudate lobe of the liver to 7.3 cm, with associated heterogeneity. The visualized portions of the spleen are unremarkable. Stones are noted within the gallbladder; the gallbladder is largely contracted, with minimal nonspecific edema about the  gallbladder. The visualized portions of the adrenal glands and kidneys are grossly unremarkable. Musculoskeletal: There is suggestion of minimally displaced fractures of the right anterior fourth through sixth ribs. There appears to be 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, incompletely imaged on this study. The visualized musculature is unremarkable in appearance. IMPRESSION: 1. Small right-sided pleural effusion, somewhat loculated in appearance. Fluid tracking along the right major fissure. Small to moderate left-sided pleural effusion, with consolidation of the left lower lobe. Pneumonia is a concern. Underlying mass cannot be excluded, given the pleural effusion. 2. Suggestion of minimally displaced fractures of the right anterior fourth through sixth ribs. 3. Small pericardial effusion noted. 4. Somewhat unusual enlargement of the caudate lobe of the liver to 7.3 cm, with associated heterogeneity. Would correlate with LFTs, and consider dynamic liver protocol MRI or CT for further evaluation, when and as deemed clinically appropriate. 5. Cholelithiasis. Gallbladder largely contracted, with minimal nonspecific edema about the gallbladder. Electronically Signed   By: Garald Balding M.D.   On: 09/16/2018 01:29   US Venous Img Lower Bilateral  Result Date: 09/15/2018 CLINICAL DATA:  Bilateral lower extremity edema. History of recent hospitalization. Evaluate for DVT. EXAM: BILATERAL LOWER EXTREMITY VENOUS DOPPLER ULTRASOUND TECHNIQUE: Gray-scale sonography with graded compression, as well as color Doppler and duplex ultrasound were performed to evaluate the lower extremity deep venous systems from the level of the common femoral vein and including the common femoral, femoral, profunda femoral, popliteal and calf veins including the posterior tibial, peroneal and gastrocnemius veins when visible. The superficial great saphenous vein was also interrogated. Spectral Doppler was utilized to evaluate  flow at rest and with distal augmentation maneuvers in the common femoral, femoral and popliteal veins. COMPARISON:  None. FINDINGS: RIGHT LOWER EXTREMITY Common Femoral Vein: No evidence of thrombus. Normal compressibility, respiratory phasicity and response to augmentation. Saphenofemoral Junction: No evidence of thrombus. Normal compressibility and flow on color Doppler imaging. Profunda Femoral Vein: No evidence of thrombus. Normal compressibility and flow on color Doppler imaging. Femoral Vein: No evidence of thrombus. Normal compressibility, respiratory phasicity and response to augmentation. Popliteal Vein: No evidence of thrombus. Normal compressibility, respiratory phasicity and response to augmentation. Calf Veins: No evidence of thrombus. Normal compressibility and flow on color Doppler imaging. Superficial Great Saphenous Vein: No evidence of thrombus. Normal compressibility. Venous Reflux:  None. Other Findings: There is a large amount of subcutaneous edema the level of the right lower leg and calf. Note is made of a serpiginous approximately 1.6 x 5.1 x 2.7 cm fluid collection within the right popliteal fossa favored to represent a Baker cyst. LEFT LOWER EXTREMITY Common Femoral Vein: No evidence of thrombus. Normal compressibility, respiratory phasicity and response  to augmentation. Saphenofemoral Junction: No evidence of thrombus. Normal compressibility and flow on color Doppler imaging. Profunda Femoral Vein: No evidence of thrombus. Normal compressibility and flow on color Doppler imaging. Femoral Vein: No evidence of thrombus. Normal compressibility, respiratory phasicity and response to augmentation. Popliteal Vein: No evidence of thrombus. Normal compressibility, respiratory phasicity and response to augmentation. Calf Veins: No evidence of thrombus. Normal compressibility and flow on color Doppler imaging. Superficial Great Saphenous Vein: No evidence of thrombus. Normal compressibility. Venous  Reflux:  None. Other Findings: There is a large amount of subcutaneous edema at the level the left lower leg and calf. IMPRESSION: No evidence of DVT within either lower extremity. Electronically Signed   By: Sandi Mariscal M.D.   On: 09/15/2018 11:44    EKG: Independently reviewed.  Sinus tachycardia  Assessment/Plan Present on Admission: . Loculated pleural effusion: Discussed this case with pulmonary.  Given location of fluid and very limited access, they felt he would not be able to drain this fluid.  Then discussed case with cardiothoracic, who felt that effusion was small and not necessarily able to be drained.  That said, they recommended an analysis of fluid on the left from larger pleural effusion.  Given his anasarca, I suspect that this is transudate of, especially with procalcitonin levels being so low and no signs of sepsis.  Discussed with interventional radiology who will plan to see patient on 11/15 for a thoracentesis.  Marland Kitchen Hyponatremia: This is almost definitely from hypervolemic hyponatremia from beer potomania.  Correction of potassium unfortunately did not change his sodium levels.  Fortunately, patient states that he has since cut back significantly on his drinking.  We will try demeclocycline, diuretics  . Hypokalemia/hypomagnesemia: Replaced both magnesium and potassium orally.  . Protein calorie malnutrition ([place]): Albumin level is quite low, patient gets most of his calories from beer.  Nutrition to see.  Have started Ensure.  . Anasarca: Significant third spacing throughout including legs and scrotum.  Have ordered a dose of IV albumin and then to start Lasix afterwards.  Alcohol abuse: Patient was heavily drinking beer to the point where there is even a question during his last hospitalization a family was bringing him in beers.  He states that he has greatly cut back and is only had one beer since he was discharged 3 days ago.  His son says this is hopefully true.  Placing  patient on CIWA protocol.  Weakness: Secondary to above.  Nutrition to see.  Multiple right-sided rib fractures: Likely secondary to previous falls brought on by alcohol intoxication  Active Problems:   Loculated pleural effusion   Hyponatremia   Hypokalemia   Protein calorie malnutrition ([facility])   Anasarca   DVT prophylaxis: SCDs  Code Status: Full code  Family Communication: Spoke with son by phone  Disposition Plan: Likely will be here for several days as we stabilize his sodium, keep electrolytes stable and remove pleural fluid  Consults called: Case discussed with cardiothoracic surgery and pulmonary. Interventional radiology  Admission status: Given need for acute hospital services and expectation patient will be here for 2 midnights, have admitted as inpatient    Annita Brod MD [facility] Pager [phone]  If 7PM-7AM, please contact night-coverage [email or web address] Password [email or web address]  09/16/2018, 4:05 PM

## 2018-09-16 NOTE — Progress Notes (Signed)
Pt weak and slow to move in the bed, requires full 2 assist for movement and states he is not able to ambulate due to the increase pain in his feet and legs. Elevated scrotum on pad. Will cont to monitor. SRP, RN

## 2018-09-16 NOTE — ED Notes (Signed)
Report to [facility] 

## 2018-09-16 NOTE — Progress Notes (Addendum)
Spoke with Rowe Robert, PA Thoracentesis planned for tomorrow. Pt will need to be NPO tonight. Will update nursing staff. Son  [name]) [phone] will need to provide informed consent. SPR, RN

## 2018-09-16 NOTE — ED Notes (Signed)
[facility] notified (Kim) - patient ready for transport 

## 2018-09-17 ENCOUNTER — Inpatient Hospital Stay (HOSPITAL_COMMUNITY): Payer: Medicaid Other

## 2018-09-17 DIAGNOSIS — E876 Hypokalemia: Secondary | ICD-10-CM

## 2018-09-17 DIAGNOSIS — F1010 Alcohol abuse, uncomplicated: Secondary | ICD-10-CM

## 2018-09-17 DIAGNOSIS — R627 Adult failure to thrive: Secondary | ICD-10-CM

## 2018-09-17 LAB — LACTATE DEHYDROGENASE, PLEURAL OR PERITONEAL FLUID: LD, Fluid: 70 U/L — ABNORMAL HIGH (ref 3–23)

## 2018-09-17 LAB — EXPECTORATED SPUTUM ASSESSMENT W GRAM STAIN, RFLX TO RESP C

## 2018-09-17 LAB — BASIC METABOLIC PANEL
Anion gap: 4 — ABNORMAL LOW (ref 5–15)
CHLORIDE: 97 mmol/L — AB (ref 98–111)
CO2: 24 mmol/L (ref 22–32)
Calcium: 7 mg/dL — ABNORMAL LOW (ref 8.9–10.3)
Creatinine, Ser: 0.45 mg/dL — ABNORMAL LOW (ref 0.61–1.24)
GFR calc Af Amer: >60 mL/min (ref >60)
GFR calc non Af Amer: >60 mL/min (ref >60)
Glucose, Bld: 73 mg/dL (ref 70–99)
Potassium: 3.4 mmol/L — ABNORMAL LOW (ref 3.5–5.1)
Sodium: 125 mmol/L — ABNORMAL LOW (ref 135–145)

## 2018-09-17 LAB — OSMOLALITY, URINE: OSMOLALITY UR: 203 mosm/kg — AB (ref 300–900)

## 2018-09-17 LAB — OSMOLALITY: Osmolality: 257 mOsm/kg — ABNORMAL LOW (ref 275–295)

## 2018-09-17 LAB — BODY FLUID CELL COUNT WITH DIFFERENTIAL
EOS FL: 0 %
Lymphs, Fluid: 11 %
MONOCYTE-MACROPHAGE-SEROUS FLUID: 81 % (ref 50–90)
Neutrophil Count, Fluid: 8 % (ref 0–25)
WBC FLUID: 390 uL (ref 0–1000)

## 2018-09-17 LAB — GLUCOSE, PLEURAL OR PERITONEAL FLUID: GLUCOSE FL: 81 mg/dL

## 2018-09-17 LAB — SODIUM, URINE, RANDOM: SODIUM UR: 59 mmol/L

## 2018-09-17 LAB — MAGNESIUM: Magnesium: 1.6 mg/dL — ABNORMAL LOW (ref 1.7–2.4)

## 2018-09-17 LAB — STREP PNEUMONIAE URINARY ANTIGEN: Strep Pneumo Urinary Antigen: NEGATIVE

## 2018-09-17 LAB — EXPECTORATED SPUTUM ASSESSMENT W REFEX TO RESP CULTURE

## 2018-09-17 LAB — PROTEIN, PLEURAL OR PERITONEAL FLUID

## 2018-09-17 LAB — MRSA PCR SCREENING: MRSA BY PCR: NEGATIVE

## 2018-09-17 MED ORDER — MAGNESIUM SULFATE 2 GM/50ML IV SOLN
2.0000 g | Freq: Once | INTRAVENOUS | Status: AC
  Administered 2018-09-17: 2 g via INTRAVENOUS
  Filled 2018-09-17: qty 50

## 2018-09-17 MED ORDER — VITAMIN D3 25 MCG (1000 UNIT) PO TABS
1000.0000 [IU] | ORAL_TABLET | Freq: Every day | ORAL | Status: DC
  Administered 2018-09-17 – 2018-09-23 (×6): 1000 [IU] via ORAL
  Filled 2018-09-17 (×8): qty 1

## 2018-09-17 MED ORDER — LIDOCAINE HCL 1 % IJ SOLN
INTRAMUSCULAR | Status: AC
  Filled 2018-09-17: qty 10

## 2018-09-17 MED ORDER — SPIRONOLACTONE 25 MG PO TABS
100.0000 mg | ORAL_TABLET | Freq: Every day | ORAL | Status: DC
  Administered 2018-09-17 – 2018-09-23 (×6): 100 mg via ORAL
  Filled 2018-09-17 (×8): qty 4

## 2018-09-17 MED ORDER — BENZONATATE 100 MG PO CAPS
100.0000 mg | ORAL_CAPSULE | Freq: Three times a day (TID) | ORAL | Status: DC | PRN
  Administered 2018-09-17: 100 mg via ORAL
  Filled 2018-09-17: qty 1

## 2018-09-17 MED ORDER — POTASSIUM CHLORIDE CRYS ER 20 MEQ PO TBCR
40.0000 meq | EXTENDED_RELEASE_TABLET | Freq: Once | ORAL | Status: AC
  Administered 2018-09-17: 40 meq via ORAL
  Filled 2018-09-17: qty 2

## 2018-09-17 MED ORDER — VITAMIN B-12 1000 MCG PO TABS
1000.0000 ug | ORAL_TABLET | Freq: Every day | ORAL | Status: DC
  Administered 2018-09-17 – 2018-09-23 (×6): 1000 ug via ORAL
  Filled 2018-09-17 (×8): qty 1

## 2018-09-17 NOTE — Progress Notes (Addendum)
Initial Nutrition Assessment  DOCUMENTATION CODES:   Obesity unspecified  INTERVENTION:  - Diet advancement as medically feasible. - Ensure Enlive ordered BID yesterday evening; continue this supplement and each bottle provides 350 kcal and 20 grams of protein.  - Will monitor for nutrition-related needs at follow-up.    NUTRITION DIAGNOSIS:   Inadequate oral intake related to inability to eat as evidenced by NPO status.  GOAL:   Patient will meet greater than or equal to 90% of their needs  MONITOR:   Diet advancement, Weight trends, Labs, I & O's  REASON FOR ASSESSMENT:   Consult Assessment of nutrition requirement/status  ASSESSMENT:   62 y.o. male with medical history significant for recent hospitalization for severe anemia which was felt to be secondary to beer potomania. Patient was discharged on 11/10. Since being home, he continued to feel very weak. He also had follow-up outpatient blood work done which noted a low sodium so he was sent to the ED where repeat blood work did confirm his sodium level, which had been 127 mmol/L on discharge and now at 122 mmol/L. CXR showed an increase in size of L pleural effusion as well as appearances of trapped fluid.  Limited weight hx available in the chart shows that current weight is 195.5 lb and weight on 11/9 was 183 lb; 12.5 lb weight gain in <1 week. This is likely fluid related given abdominal distention and edema to BLE.   Patient reports eating well during last hospitalization, mainly ~75% of meals. He states that over the past few days appetite slightly decreased and it was harder for him to get around d/t edema. He was on 2 gm Na, 2L fluid restriction yesterday prior to being made NPO today and was able to eat ~50% of dinner last night. He is now NPO for planned thoracentesis.     Medications reviewed; 12.5 g albumin x1 dose yesterday, 20 mg IV Lasix BID, 2 g IV Mg sulfate x1 run today, daily multivitamin with minerals,  10 mEq IV KCl x1 run yesterday, 40 mEq K-Dur x1 dose today, 100 mg oral thiamine/day.  Labs reviewed; Na: 125 mmol/L, K: 3.4 mmol/L, Cl: 97 mmol/L, BUN: <5 mg/dL, creatinine: 0.45 mg/dL, Ca: 7 mg/dL, Mg: 1.6 mg/dL.      NUTRITION - FOCUSED PHYSICAL EXAM:  Completed; no muscle and no fat wasting, noted severe edema to BLE.   Diet Order:   Diet Order            Diet NPO time specified  Diet effective midnight              EDUCATION NEEDS:   No education needs have been identified at this time  Skin:  Skin Assessment: Reviewed RN Assessment  Last BM:  11/14  Height:   Ht Readings from Last 1 Encounters:  09/15/18 5\' 6"  (1.676 m)    Weight:   Wt Readings from Last 1 Encounters:  09/17/18 88.7 kg    Ideal Body Weight:  64.54 kg  BMI:  Body mass index is 31.56 kg/m.  Estimated Nutritional Needs:   Kcal:  1950-2130 kcal  Protein:  90-105 grams  Fluid:  >/= 1.5 L/day     Jarome Matin, MS, RD, LDN, [facility] Inpatient Clinical Dietitian Pager # [phone] After hours/weekend pager # [phone]

## 2018-09-17 NOTE — Procedures (Signed)
Ultrasound-guided diagnostic and therapeutic left  thoracentesis performed yielding 710 cc of yellow fluid. No immediate complications. Follow-up chest x-ray pending. The fluid was sent to the lab for preordered studies.

## 2018-09-17 NOTE — Progress Notes (Signed)
PROGRESS NOTE  Gregory Rowe GYJ:[id number] DOB: 1955/12/13 DOA: 09/15/2018 PCP: Patient, No Pcp Per  Brief Summary: perER triage: "Pt brought in tonight by his son  Son states pt had a follow up visit with his doctor today and tonight they called and told them his sodium level was low, had low potassium, and had elevated heart failure protein levels  Also pt was anemic and possibly has pneumonia   Pt has generalized weakness and is pale  Pt states he is short of breath upon exertion "  Patient with a history of alcoholism who was recently admitted to the hospital for severe anemia with a hemoglobin of 3 requiring 5 units of packed red blood cells, hyponatremia related to beer potomania, found to have pleural effusions likely due to low oncotic pressures in the setting of malnutrition and alcohol use (reassuring echo during recent admission).  He presents today after post admission follow-up with a primary care provider who noted patient had worsening hyponatremia and new focal opacity on the chest x-ray.  Patient was discharged 3 days ago from the hospital.  Since then he reports being severely fatigued with dyspnea on exertion.  Patient endorses nonproductive cough.  Denies any fevers or chills  HPI/Recap of past 24 hours:  He is edematous, no fever, denies pain, He is incontinence of urine  Assessment/Plan: Active Problems:   Loculated pleural effusion   Hyponatremia   Hypokalemia   Protein calorie malnutrition (HCC)   Anasarca  Hyponatremia: significant hypervolemic  -Sodium 122 on presentation, likely multifactorial, with beer hyponatremia, volume overload, could also have SIADH. Will get urine sodium/urine osmo/serum osmo/uric acid, test may not be reliable due to already received lasix -continue lasix, fluids restriction  Hypokalemia:  k 2.8 on presentation Replaced, remain low , continue to replace  Hypomagnesemia: Remain low continue to replace  Severe  anasarca/generalized edema Bilateral Pleural effusion ( right small but possible loculated, left side moderate) s/p left side thoracentesis today, will follow up on fluids study, no fever, will hold off abx Per Admitting MD Gregory Rowe "Discussed this case with pulmonary.  Given location of fluid and very limited access, they felt he would not be able to drain this fluid.  Then discussed case with cardiothoracic, who felt that effusion was small and not necessarily able to be drained. " Venous doppler le negative for DVT Recent echocardiogram from 09/11/2018  lvef 55-60% Continue iv lasix , add on spironolactone  Microcytic anemia: was recently admitted to the hospital for severe anemia with a hemoglobin of 3 requiring 5 units of packed red blood cells FOBT negative on 11/7 Hbg 9.8 on presentation  EtOH abuse Per Gregory Rowe: " Recently cut back to 5-6 beers/day from prior 24 beers/day - no withdrawal during stay, but it was revealed his son was sneaking him beer every night while he was admitted - counseled pt on damage to his body from ongoing alcohol use, and link to his life threatening anemia - he voiced understanding, but made it clear to me he has no intention of stopping drinking - he has agreed to "cut back more" " On CIWA protocol  Rib fractures: minimally displaced fractures of the right anterior fourth through sixth ribs.  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  FTT/severe anasarca/generalized edema Will gt PT eval  Overall prognosis is poor  Code Status: full  Family Communication: patient   Disposition Plan: not ready to discharge   Consultants:  Gregory Rowe discussed case with pulmonology  and thoracic surgery  IR  Procedures:  US guided diagnostic and therapeutic left sided thoracentesis  Antibiotics:  none   Objective: BP (!) 101/46   Pulse 93   Temp 98.7 F (37.1 C) (Oral)   Resp 16   Wt 88.7 kg   SpO2 98%   BMI 31.56 kg/m    Intake/Output Summary (Last 24 hours) at 09/17/2018 1705 Last data filed at 09/17/2018 1316 Gross per 24 hour  Intake -  Output 600 ml  Net -600 ml   Filed Weights   09/17/18 0500  Weight: 88.7 kg    Exam: Patient is examined daily including today on 09/17/2018, exams remain the same as of yesterday except that has changed    General:  Chronically ill, pale, aaox3  Cardiovascular: RRR  Respiratory: diminished at basis  Abdomen: Soft/ND/NT, positive BS  Musculoskeletal: generalized Edema, significant scrotal edema  Neuro: alert, oriented   Data Reviewed: Basic Metabolic Panel: Recent Labs  Lab 09/12/18 0301 09/15/18 1203 09/16/18 0203 09/16/18 1211 09/17/18 0510  NA 127* 122* 122* 123* 125*  K 3.8 3.3* 2.8* 3.6 3.4*  CL 103 93* 94* 95* 97*  CO2 21* 27 22 22 24   GLUCOSE 91 106* 82 75 73  BUN 5* 4* <5* <5* <5*  CREATININE 0.52* 0.40 0.36* 0.41* 0.45*  CALCIUM 7.2* 7.0* 6.5* 6.8* 7.0*  MG 1.7 1.5  --   --  1.6*  PHOS 1.8*  --   --   --   --    Liver Function Tests: Recent Labs  Lab 09/11/18 0205 09/12/18 0301 09/15/18 1203  AST 18 18 25   ALT 12 11 11   ALKPHOS 69 66 84  BILITOT 2.6* 1.7* 2.1*  PROT 6.2* 5.9* 6.3  ALBUMIN 1.8* 1.7* 2.1*   No results for input(s): LIPASE, AMYLASE in the last 168 hours. No results for input(s): AMMONIA in the last 168 hours. CBC: Recent Labs  Lab 09/11/18 0205 09/12/18 0301 09/15/18 1203  WBC 7.3 7.3 6.9  NEUTROABS  --   --  4.3  HGB 8.1* 8.3* 9.8*  HCT 26.1* 26.8* 30.6*  MCV 75.9* 76.6* 78.1  PLT 205 212 207.0   Cardiac Enzymes:   No results for input(s): CKTOTAL, CKMB, CKMBINDEX, TROPONINI in the last 168 hours. BNP (last 3 results) No results for input(s): BNP in the last 8760 hours.  ProBNP (last 3 results) Recent Labs    09/15/18 1203  PROBNP 776.0*    CBG: No results for input(s): GLUCAP in the last 168 hours.  Recent Results (from the past 240 hour(s))  Blood culture (routine x 2)      Status: None   Collection Time: 09/09/18  2:49 PM  Result Value Ref Range Status   Specimen Description BLOOD LEFT ARM  Final   Special Requests   Final    BOTTLES DRAWN AEROBIC AND ANAEROBIC Blood Culture results may not be optimal due to an inadequate volume of blood received in culture bottles   Culture   Final    NO GROWTH 5 DAYS Performed at [facility], [place] [address], [place], [place] [address]    Report Status 09/14/2018 FINAL  Final  Blood culture (routine x 2)     Status: None   Collection Time: 09/09/18  2:50 PM  Result Value Ref Range Status   Specimen Description BLOOD RIGHT ARM  Final   Special Requests   Final    BOTTLES DRAWN AEROBIC AND ANAEROBIC Blood Culture adequate volume  Culture   Final    NO GROWTH 5 DAYS Performed at [facility], [place] [address], [place], [place] [address]    Report Status 09/14/2018 FINAL  Final  MRSA PCR Screening     Status: None   Collection Time: 09/09/18  7:58 PM  Result Value Ref Range Status   MRSA by PCR NEGATIVE NEGATIVE Final    Comment:        The GeneXpert MRSA Assay (FDA approved for NASAL specimens only), is one component of a comprehensive MRSA colonization surveillance program. It is not intended to diagnose MRSA infection nor to guide or monitor treatment for MRSA infections. Performed at [facility], [place] [address] [place],  [address]      Studies: Dg Chest 1 View  Result Date: 09/17/2018 CLINICAL DATA:  Status post LEFT thoracentesis. EXAM: CHEST  1 VIEW COMPARISON:  Chest x-ray dated 09/15/2018. FINDINGS: Significantly improved aeration at the LEFT lung base status post thoracentesis. No pneumothorax seen. Patchy opacities at the RIGHT lung base, mostly due to loculated pleural effusion within the RIGHT lung fissure as demonstrated on chest CT of 09/16/2018. Heart size and mediastinal contours appear grossly stable. 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 IMPRESSION: 1. Significantly improved aeration at the LEFT lung base status post thoracentesis. No pneumothorax. 2. 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 Electronically Signed   By: Franki Cabot M.D.   On: 09/17/2018 15:52   US Thoracentesis Asp Pleural Space W/img Guide  Result Date: 09/17/2018 INDICATION: Patient with history of alcohol abuse, cough, dyspnea, left pleural effusion. Request made for diagnostic and therapeutic left thoracentesis. EXAM: ULTRASOUND GUIDED DIAGNOSTIC AND THERAPEUTIC LEFT THORACENTESIS MEDICATIONS: None COMPLICATIONS: None immediate. PROCEDURE: An ultrasound guided thoracentesis was thoroughly discussed with the patient and questions answered. The benefits, risks, alternatives and complications were also discussed. The patient understands and wishes to proceed with the procedure. Written consent was obtained. Ultrasound was performed to localize and mark an adequate pocket of fluid in the left chest. The area was then prepped and draped in the normal sterile fashion. 1% Lidocaine was used for local anesthesia. Under ultrasound guidance a 6 Fr Safe-T-Centesis catheter was introduced. Thoracentesis was performed. The catheter was removed and a dressing applied. FINDINGS: A total of approximately 710 cc of yellow fluid was removed. Samples were sent to the laboratory as requested by the clinical team. IMPRESSION: Successful ultrasound guided diagnostic and therapeutic left thoracentesis yielding 710 cc of pleural fluid. Read by: Rowe Robert, PA-C Electronically Signed   By: Jacqulynn Cadet M.D.   On: 09/17/2018 15:50    Scheduled Meds: . feeding supplement (ENSURE ENLIVE)  237 mL Oral BID BM  . folic acid  1 mg Oral Daily  . furosemide  20 mg Intravenous Q12H  . lidocaine      . multivitamin with minerals  1 tablet Oral Daily  . thiamine  100 mg Oral Daily   Or  .  thiamine  100 mg Intravenous Daily    Continuous Infusions: . sodium chloride Stopped (09/16/18 0504)  . potassium chloride       Time spent: 65mins I have personally reviewed and interpreted on  09/17/2018 daily labs, tele strips, imagings as discussed above under date review session and assessment and plans.  I reviewed all nursing notes, pharmacy notes, consultant notes,  vitals, pertinent old records  I  have discussed plan of care as described above with RN , patient n 09/17/2018   Florencia Reasons MD, PhD  [facility] Pager [phone]. If 7PM-7AM, please contact night-coverage at [email or web address], password [facility] 09/17/2018, 5:05 PM  LOS: 1 day

## 2018-09-18 ENCOUNTER — Inpatient Hospital Stay (HOSPITAL_COMMUNITY): Payer: Medicaid Other

## 2018-09-18 LAB — CBC WITH DIFFERENTIAL/PLATELET
Abs Immature Granulocytes: 0.03 10*3/uL (ref 0.00–0.07)
BASOS ABS: 0.1 10*3/uL (ref 0.0–0.1)
Basophils Relative: 1 %
EOS PCT: 6 %
Eosinophils Absolute: 0.4 10*3/uL (ref 0.0–0.5)
HEMATOCRIT: 32.7 % — AB (ref 39.0–52.0)
HEMOGLOBIN: 9.7 g/dL — AB (ref 13.0–17.0)
IMMATURE GRANULOCYTES: 0 %
LYMPHS ABS: 1.4 10*3/uL (ref 0.7–4.0)
LYMPHS PCT: 20 %
MCH: 24.1 pg — ABNORMAL LOW (ref 26.0–34.0)
MCHC: 29.7 g/dL — AB (ref 30.0–36.0)
MCV: 81.3 fL (ref 80.0–100.0)
MONOS PCT: 14 %
Monocytes Absolute: 0.9 10*3/uL (ref 0.1–1.0)
NRBC: 0 % (ref 0.0–0.2)
Neutro Abs: 4 10*3/uL (ref 1.7–7.7)
Neutrophils Relative %: 59 %
Platelets: 210 10*3/uL (ref 150–400)
RBC: 4.02 MIL/uL — ABNORMAL LOW (ref 4.22–5.81)
RDW: 28.3 % — ABNORMAL HIGH (ref 11.5–15.5)
WBC: 6.8 10*3/uL (ref 4.0–10.5)

## 2018-09-18 LAB — COMPREHENSIVE METABOLIC PANEL
ALBUMIN: 1.8 g/dL — AB (ref 3.5–5.0)
ALK PHOS: 92 U/L (ref 38–126)
ALT: 12 U/L (ref 0–44)
ANION GAP: 7 (ref 5–15)
AST: 26 U/L (ref 15–41)
BUN: <5 mg/dL — ABNORMAL LOW (ref 8–23)
CALCIUM: 7.3 mg/dL — AB (ref 8.9–10.3)
CO2: 26 mmol/L (ref 22–32)
CREATININE: 0.43 mg/dL — AB (ref 0.61–1.24)
Chloride: 94 mmol/L — ABNORMAL LOW (ref 98–111)
GFR calc Af Amer: >60 mL/min (ref >60)
GFR calc non Af Amer: >60 mL/min (ref >60)
GLUCOSE: 80 mg/dL (ref 70–99)
Potassium: 2.9 mmol/L — ABNORMAL LOW (ref 3.5–5.1)
Sodium: 127 mmol/L — ABNORMAL LOW (ref 135–145)
Total Bilirubin: 2.1 mg/dL — ABNORMAL HIGH (ref 0.3–1.2)
Total Protein: 5.6 g/dL — ABNORMAL LOW (ref 6.5–8.1)

## 2018-09-18 LAB — BASIC METABOLIC PANEL
ANION GAP: 7 (ref 5–15)
BUN: <5 mg/dL — ABNORMAL LOW (ref 8–23)
CHLORIDE: 93 mmol/L — AB (ref 98–111)
CO2: 26 mmol/L (ref 22–32)
Calcium: 7.1 mg/dL — ABNORMAL LOW (ref 8.9–10.3)
Creatinine, Ser: 0.43 mg/dL — ABNORMAL LOW (ref 0.61–1.24)
GFR calc non Af Amer: >60 mL/min (ref >60)
GLUCOSE: 75 mg/dL (ref 70–99)
POTASSIUM: 3.7 mmol/L (ref 3.5–5.1)
Sodium: 126 mmol/L — ABNORMAL LOW (ref 135–145)

## 2018-09-18 LAB — URINALYSIS, ROUTINE W REFLEX MICROSCOPIC
Bilirubin Urine: NEGATIVE
Glucose, UA: NEGATIVE mg/dL
Ketones, ur: 5 mg/dL — AB
NITRITE: NEGATIVE
PROTEIN: NEGATIVE mg/dL
SPECIFIC GRAVITY, URINE: 1.011 (ref 1.005–1.030)
pH: 5 (ref 5.0–8.0)

## 2018-09-18 LAB — EXPECTORATED SPUTUM ASSESSMENT W REFEX TO RESP CULTURE

## 2018-09-18 LAB — MAGNESIUM
Magnesium: 1.6 mg/dL — ABNORMAL LOW (ref 1.7–2.4)
Magnesium: 1.6 mg/dL — ABNORMAL LOW (ref 1.7–2.4)

## 2018-09-18 LAB — PROTIME-INR
INR: 1.96
Prothrombin Time: 22.1 seconds — ABNORMAL HIGH (ref 11.4–15.2)

## 2018-09-18 LAB — CORTISOL: Cortisol, Plasma: 5.6 ug/dL

## 2018-09-18 LAB — TSH: TSH: 4.866 u[IU]/mL — ABNORMAL HIGH (ref 0.350–4.500)

## 2018-09-18 LAB — URIC ACID, RANDOM URINE: URIC ACID, URINE: 3.5 mg/dL

## 2018-09-18 MED ORDER — MAGNESIUM SULFATE 2 GM/50ML IV SOLN
2.0000 g | Freq: Once | INTRAVENOUS | Status: AC
  Administered 2018-09-18: 2 g via INTRAVENOUS
  Filled 2018-09-18: qty 50

## 2018-09-18 MED ORDER — IOHEXOL 300 MG/ML  SOLN
100.0000 mL | Freq: Once | INTRAMUSCULAR | Status: AC | PRN
  Administered 2018-09-18: 100 mL via INTRAVENOUS

## 2018-09-18 MED ORDER — SODIUM CHLORIDE (PF) 0.9 % IJ SOLN
INTRAMUSCULAR | Status: AC
  Filled 2018-09-18: qty 50

## 2018-09-18 MED ORDER — POTASSIUM CHLORIDE CRYS ER 20 MEQ PO TBCR
40.0000 meq | EXTENDED_RELEASE_TABLET | ORAL | Status: AC
  Administered 2018-09-18 (×2): 40 meq via ORAL
  Filled 2018-09-18 (×2): qty 2

## 2018-09-18 MED ORDER — MAGNESIUM SULFATE 4 GM/100ML IV SOLN
4.0000 g | Freq: Once | INTRAVENOUS | Status: AC
  Administered 2018-09-18: 4 g via INTRAVENOUS
  Filled 2018-09-18: qty 100

## 2018-09-18 MED ORDER — POTASSIUM CHLORIDE CRYS ER 20 MEQ PO TBCR
20.0000 meq | EXTENDED_RELEASE_TABLET | Freq: Once | ORAL | Status: AC
  Administered 2018-09-18: 20 meq via ORAL
  Filled 2018-09-18: qty 1

## 2018-09-18 NOTE — Progress Notes (Signed)
Sputum and urine specimens sent as ordered. SRP, RN

## 2018-09-18 NOTE — Plan of Care (Addendum)
Pt reports only mild anxiety and states his pain is generalized and chronic at around a 6/10. Pt C/O SOB and his O2 Sat was in the high 70's. Pt was put on 2 LPM of O2. Later in the shift pt required 3 LPM in order to keep sats> 89%. While assessing pt there seemed to be tobacco on pt's body and in his bed. Pt admitted to having smokeless tobacco in his room. Pt was asked to remove tobacco from his mouth and can was discarded outside of his room.
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
## 2018-09-18 NOTE — Progress Notes (Signed)
Temp is back up to 102.3.  Medicated with tylenol.  Had Urine and blood cultures last night. Will continue to monitor

## 2018-09-18 NOTE — Evaluation (Signed)
Physical Therapy Evaluation Patient Details Name: Gregory Rowe MRN: [id number] DOB: 01-Feb-1956 Today's Date: 09/18/2018   History of Present Illness  Patient with a history of alcoholism who was recently admitted to the hospital for severe anemia with a hemoglobin of 3 requiring 5 units of packed red blood cells, hyponatremia related to beer potomania, found to have pleural effusions likely due to low oncotic pressures in the setting of malnutrition and alcohol use   Clinical Impression  Pt admitted with above diagnosis. Pt currently with functional limitations due to the deficits listed below (see PT Problem List).  Pt will benefit from skilled PT to increase their independence and safety with mobility to allow discharge to the venue listed below.  Pt requiring total assist for bed mobility at this time due to pain "everywhere" however more specifically ribs and scrotum (red, edematous).  Pt unable to tolerate standing at this time. Pt with SPO2 86% on room air so reapplied 3L O2 [place] for session.  Pt reports his girlfriend was assisting him with mobility prior to admission since his last admission however typically at baseline he is independent.  Pt plans to d/c home however recommending SNF at this time.    Follow Up Recommendations SNF    Equipment Recommendations  None recommended by PT    Recommendations for Other Services       Precautions / Restrictions Precautions Precautions: Fall Precaution Comments: monitor sats      Mobility  Bed Mobility Overal bed mobility: Needs Assistance Bed Mobility: Supine to Sit;Sit to Supine     Supine to sit: Total assist;+2 for physical assistance Sit to supine: Total assist;+2 for physical assistance   General bed mobility comments: pt with rib pain and scrotal edema/pain limiting bed mobility and requiring total assist, utilized bed pad for positioning as able, elevated scrotum upon return to supine  Transfers                  General transfer comment: pt felt unable to tolerate - too weak and too much scrotal pain  Ambulation/Gait                Stairs            Wheelchair Mobility    Modified Rankin (Stroke Patients Only)       Balance Overall balance assessment: Needs assistance Sitting-balance support: Bilateral upper extremity supported;Feet supported Sitting balance-Leahy Scale: Poor Sitting balance - Comments: requires UE support, also has new rib fractures                                     Pertinent Vitals/Pain Pain Assessment: Faces Faces Pain Scale: Hurts whole lot Pain Location: "everywhere" Pain Descriptors / Indicators: Discomfort;Tender;Sore Pain Intervention(s): Limited activity within patient's tolerance;Repositioned;Monitored during session    [place] expects to be discharged to:: Private residence Living Arrangements: Spouse/significant other Available Help at Discharge: Family         Home Layout: One level Home Equipment: Environmental consultant - 2 wheels      Prior Function Level of Independence: Needs assistance   Gait / Transfers Assistance Needed: pt reports typically independent until recent admission, girlfriend has been assisting with bring LEs over EOB and assisting pt to standing with RW           Hand Dominance        Extremity/Trunk Assessment   Upper Extremity Assessment  Upper Extremity Assessment: Generalized weakness    Lower Extremity Assessment Lower Extremity Assessment: Generalized weakness       Communication   Communication: No difficulties  Cognition Arousal/Alertness: Awake/alert Behavior During Therapy: WFL for tasks assessed/performed Overall Cognitive Status: Within Functional Limits for tasks assessed                                        General Comments      Exercises     Assessment/Plan    PT Assessment Patient needs continued PT services  PT Problem List  Decreased strength;Decreased mobility;Decreased activity tolerance;Decreased balance;Decreased knowledge of use of DME       PT Treatment Interventions DME instruction;Therapeutic activities;Gait training;Therapeutic exercise;Patient/family education;Functional mobility training;Balance training    PT Goals (Current goals can be found in the Care Plan section)  Acute Rehab PT Goals PT Goal Formulation: With patient Time For Goal Achievement: 10/02/18 Potential to Achieve Goals: Good    Frequency Min 2X/week   Barriers to discharge        Co-evaluation               AM-PAC PT "6 Clicks" Daily Activity  Outcome Measure Difficulty turning over in bed (including adjusting bedclothes, sheets and blankets)?: Unable Difficulty moving from lying on back to sitting on the side of the bed? : Unable Difficulty sitting down on and standing up from a chair with arms (e.g., wheelchair, bedside commode, etc,.)?: Unable Help needed moving to and from a bed to chair (including a wheelchair)?: Total Help needed walking in hospital room?: Total Help needed climbing 3-5 steps with a railing? : Total 6 Click Score: 6    End of Session   Activity Tolerance: Patient limited by pain Patient left: in bed;with bed alarm set;with call bell/phone within reach   PT Visit Diagnosis: Other abnormalities of gait and mobility (R26.89);Muscle weakness (generalized) (M62.81)    Time: 9021-1155 PT Time Calculation (min) (ACUTE ONLY): 21 min   Charges:   PT Evaluation $PT Eval Low Complexity: [place], PT, DPT Acute Rehabilitation Services Office: [phone] Pager: [phone]  Trena Platt 09/18/2018, 3:23 PM

## 2018-09-19 DIAGNOSIS — E43 Unspecified severe protein-calorie malnutrition: Secondary | ICD-10-CM

## 2018-09-19 DIAGNOSIS — Z6826 Body mass index (BMI) 26.0-26.9, adult: Secondary | ICD-10-CM

## 2018-09-19 DIAGNOSIS — J90 Pleural effusion, not elsewhere classified: Principal | ICD-10-CM

## 2018-09-19 DIAGNOSIS — R5081 Fever presenting with conditions classified elsewhere: Secondary | ICD-10-CM

## 2018-09-19 DIAGNOSIS — K769 Liver disease, unspecified: Secondary | ICD-10-CM

## 2018-09-19 DIAGNOSIS — D509 Iron deficiency anemia, unspecified: Secondary | ICD-10-CM

## 2018-09-19 LAB — BASIC METABOLIC PANEL
ANION GAP: 4 — AB (ref 5–15)
BUN: <5 mg/dL — ABNORMAL LOW (ref 8–23)
CALCIUM: 7.3 mg/dL — AB (ref 8.9–10.3)
CO2: 30 mmol/L (ref 22–32)
CREATININE: 0.44 mg/dL — AB (ref 0.61–1.24)
Chloride: 94 mmol/L — ABNORMAL LOW (ref 98–111)
GFR calc Af Amer: >60 mL/min (ref >60)
GLUCOSE: 85 mg/dL (ref 70–99)
Potassium: 3.9 mmol/L (ref 3.5–5.1)
Sodium: 128 mmol/L — ABNORMAL LOW (ref 135–145)

## 2018-09-19 LAB — OCCULT BLOOD X 1 CARD TO LAB, STOOL: Fecal Occult Bld: POSITIVE — AB

## 2018-09-19 LAB — MAGNESIUM: MAGNESIUM: 2.1 mg/dL (ref 1.7–2.4)

## 2018-09-19 LAB — T4, FREE: FREE T4: 1 ng/dL (ref 0.82–1.77)

## 2018-09-19 MED ORDER — SODIUM CHLORIDE 0.9 % IV SOLN
3.0000 g | Freq: Four times a day (QID) | INTRAVENOUS | Status: DC
  Administered 2018-09-19 – 2018-09-24 (×19): 3 g via INTRAVENOUS
  Filled 2018-09-19 (×19): qty 3

## 2018-09-19 MED ORDER — SODIUM CHLORIDE 0.9 % IV SOLN
510.0000 mg | Freq: Once | INTRAVENOUS | Status: AC
  Administered 2018-09-19: 510 mg via INTRAVENOUS
  Filled 2018-09-19: qty 17

## 2018-09-19 MED ORDER — SODIUM CHLORIDE 0.9 % IV SOLN
3.0000 g | Freq: Four times a day (QID) | INTRAVENOUS | Status: DC
  Administered 2018-09-19: 3 g via INTRAVENOUS
  Filled 2018-09-19 (×2): qty 3

## 2018-09-19 NOTE — Progress Notes (Signed)
Chief Complaint: Patient was seen in consultation today for liver biopsy at the request of Dr. Gunnar Bulla Magrinat  Referring Physician(s): Dr. Gunnar Bulla Magrinat  Supervising Physician: Markus Daft  Patient Status: [facility] - In-pt  History of Present Illness: Gregory Rowe is a 62 y.o. male admitted with multiple issues including fatigue, anemia, abd discomfort. He is found to have multiple liver masses. Hx of EtOH abuse as well. Had thoracentesis the other day for pleural effusion, labs/cyto pending. Liver lesions being worked up, Heme/Onc has requested perc biopsy. Chart, imaging, meds, labs reviewed. Gregory Rowe is very somnolent at the time of this consult, I could barely get him to arouse to discuss biopsy. Did not know if any of his family was coming. He was able to follow a few commands.  Past Medical History:  Diagnosis Date  . Alcoholism ([place])   . Anemia   . Hyponatremia     History reviewed. No pertinent surgical history.  Allergies: Other  Medications:  Current Facility-Administered Medications:  .  0.9 %  sodium chloride infusion, , Intravenous, PRN, Cardama, Grayce Sessions, MD, Stopped at 09/18/18 1154 .  acetaminophen (TYLENOL) tablet 650 mg, 650 mg, Oral, Q6H PRN, 650 mg at 09/19/18 1055 **OR** acetaminophen (TYLENOL) suppository 650 mg, 650 mg, Rectal, Q6H PRN, Annita Brod, MD .  benzonatate (TESSALON) capsule 100 mg, 100 mg, Oral, TID PRN, Florencia Reasons, MD, 100 mg at 09/17/18 1741 .  cholecalciferol (VITAMIN D) tablet 1,000 Units, 1,000 Units, Oral, Daily, Florencia Reasons, MD, 1,000 Units at 09/19/18 1055 .  feeding supplement (ENSURE ENLIVE) (ENSURE ENLIVE) liquid 237 mL, 237 mL, Oral, BID BM, Annita Brod, MD, 237 mL at 09/18/18 1855 .  folic acid (FOLVITE) tablet 1 mg, 1 mg, Oral, Daily, Annita Brod, MD, 1 mg at 09/19/18 1054 .  furosemide (LASIX) injection 20 mg, 20 mg, Intravenous, Q12H, Annita Brod, MD, 20 mg at 09/19/18 0717 .  LORazepam (ATIVAN)  tablet 1 mg, 1 mg, Oral, Q6H PRN **OR** LORazepam (ATIVAN) injection 1 mg, 1 mg, Intravenous, Q6H PRN, Annita Brod, MD, 1 mg at 09/18/18 2244 .  multivitamin with minerals tablet 1 tablet, 1 tablet, Oral, Daily, Annita Brod, MD, 1 tablet at 09/19/18 1054 .  ondansetron (ZOFRAN) tablet 4 mg, 4 mg, Oral, Q6H PRN, 4 mg at 09/18/18 1025 **OR** ondansetron (ZOFRAN) injection 4 mg, 4 mg, Intravenous, Q6H PRN, Annita Brod, MD .  spironolactone (ALDACTONE) tablet 100 mg, 100 mg, Oral, Daily, Florencia Reasons, MD, 100 mg at 09/19/18 1054 .  thiamine (VITAMIN B-1) tablet 100 mg, 100 mg, Oral, Daily, 100 mg at 09/19/18 1054 **OR** thiamine (B-1) injection 100 mg, 100 mg, Intravenous, Daily, Annita Brod, MD .  vitamin B-12 (CYANOCOBALAMIN) tablet 1,000 mcg, 1,000 mcg, Oral, Daily, Florencia Reasons, MD, 1,000 mcg at 09/19/18 1055    History reviewed. No pertinent family history.  Social History   Socioeconomic History  . Marital status: Single    Spouse name: Not on file  . Number of children: Not on file  . Years of education: Not on file  . Highest education level: Not on file  Occupational History  . Not on file  Social Needs  . Financial resource strain: Not hard at all  . Food insecurity:    Worry: Never true    Inability: Never true  . Transportation needs:    Medical: Patient refused    Non-medical: Patient refused  Tobacco Use  . Smoking status: Never  Smoker  . Smokeless tobacco: Current User    Types: Snuff  Substance and Sexual Activity  . Alcohol use: Yes    Alcohol/week: 4.0 - 5.0 standard drinks    Types: 4 - 5 Cans of beer per week  . Drug use: Never  . Sexual activity: Not Currently  Lifestyle  . Physical activity:    Days per week: Patient refused    Minutes per session: Patient refused  . Stress: Only a little  Relationships  . Social connections:    Talks on phone: Patient refused    Gets together: Patient refused    Attends religious service: Patient  refused    Active member of club or organization: Patient refused    Attends meetings of clubs or organizations: Patient refused    Relationship status: Patient refused  Other Topics Concern  . Not on file  Social History Narrative   Patient lives with significant other.     Review of Systems: A 12 point ROS discussed and pertinent positives are indicated in the HPI above.  All other systems are negative.  Review of Systems  Vital Signs: BP 105/60 (BP Location: Left Arm)   Pulse 87   Temp 98.9 F (37.2 C) (Oral)   Resp (!) 22   Wt 83.1 kg   SpO2 96%   BMI 29.57 kg/m   Physical Exam  Constitutional: No distress.  HENT:  Head: Normocephalic.  Mouth/Throat: Oropharynx is clear and moist.  Neck: Normal range of motion.  Cardiovascular: Normal rate, regular rhythm and normal heart sounds.  Pulmonary/Chest: Effort normal and breath sounds normal. No respiratory distress.  Abdominal: Soft. He exhibits no mass. There is no tenderness.  Skin: Skin is warm and dry.     Imaging: Dg Chest 1 View  Result Date: 09/17/2018 CLINICAL DATA:  Status post LEFT thoracentesis. EXAM: CHEST  1 VIEW COMPARISON:  Chest x-ray dated 09/15/2018. FINDINGS: Significantly improved aeration at the LEFT lung base status post thoracentesis. No pneumothorax seen. Patchy opacities at the RIGHT lung base, mostly due to loculated pleural effusion within the RIGHT lung fissure as demonstrated on chest CT of 09/16/2018. Heart size and mediastinal contours appear grossly stable. 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 IMPRESSION: 1. Significantly improved aeration at the LEFT lung base status post thoracentesis. No pneumothorax. 2. 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 Electronically Signed   By: Franki Cabot M.D.   On: 09/17/2018 15:52   Dg Chest 2 View  Result Date:  09/15/2018 CLINICAL DATA:  Recent hospitalization for transfusions secondary to anemia. History of fluid on the lungs. EXAM: CHEST - 2 VIEW COMPARISON:  Portable chest x-ray of September 09, 2018 FINDINGS: There is increased volume loss on the left with moderate sized left pleural effusion. Progressive atelectasis or pneumonia in the left lower lobe is present. On the right pleural fluid has filled the major fissure and some atelectasis medially persists. The cardiac silhouette is enlarged. The pulmonary vascularity is not clearly engorged. There is calcification in the wall of the aortic arch. There is calcification of the anterior longitudinal ligament of the thoracic spine. IMPRESSION: Increased density in the left retrocardiac region with moderate-sized left pleural effusion. Atelectasis or pneumonia here is suspected. Probable loculated fluid along the major fissure on the right medially. Chest CT scanning is recommended to better evaluate the pulmonary parenchyma and the pleural spaces. Electronically Signed  By: David  Martinique M.D.   On: 09/15/2018 11:38   Ct Chest Wo Contrast  Result Date: 09/16/2018 CLINICAL DATA:  Acute onset of shortness of breath on exertion. Hypokalemia and hyponatremia. Anemia. Right anterior rib pain. Assess left-sided pleural effusion. EXAM: CT CHEST WITHOUT CONTRAST TECHNIQUE: Multidetector CT imaging of the chest was performed following the standard protocol without IV contrast. COMPARISON:  Chest radiograph performed 09/15/2018 FINDINGS: Cardiovascular: The heart is mildly enlarged. Scattered coronary artery calcifications are seen. Mild calcification is noted at the aortic arch and proximal great vessels. Mediastinum/Nodes: A small pericardial effusion is noted. No mediastinal lymphadenopathy is seen. The mediastinum is difficult to fully characterize without contrast. The thyroid gland is grossly unremarkable. No axillary lymphadenopathy is appreciated. Lungs/Pleura: A small  right-sided pleural effusion is noted, somewhat loculated in appearance. Fluid is seen tracking along the right major fissure. There is also a small to moderate left-sided pleural effusion, with consolidation of the left lower lobe. Pneumonia is a concern. Underlying mass cannot be excluded. Upper Abdomen: There is somewhat unusual enlargement of the caudate lobe of the liver to 7.3 cm, with associated heterogeneity. The visualized portions of the spleen are unremarkable. Stones are noted within the gallbladder; the gallbladder is largely contracted, with minimal nonspecific edema about the gallbladder. The visualized portions of the adrenal glands and kidneys are grossly unremarkable. Musculoskeletal: There is suggestion of minimally displaced fractures of the right anterior fourth through sixth ribs. There appears to be 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, incompletely imaged on this study. The visualized musculature is unremarkable in appearance. IMPRESSION: 1. Small right-sided pleural effusion, somewhat loculated in appearance. Fluid tracking along the right major fissure. Small to moderate left-sided pleural effusion, with consolidation of the left lower lobe. Pneumonia is a concern. Underlying mass cannot be excluded, given the pleural effusion. 2. Suggestion of minimally displaced fractures of the right anterior fourth through sixth ribs. 3. Small pericardial effusion noted. 4. Somewhat unusual enlargement of the caudate lobe of the liver to 7.3 cm, with associated heterogeneity. Would correlate with LFTs, and consider dynamic liver protocol MRI or CT for further evaluation, when and as deemed clinically appropriate. 5. Cholelithiasis. Gallbladder largely contracted, with minimal nonspecific edema about the gallbladder. Electronically Signed   By: Garald Balding M.D.   On: 09/16/2018 01:29   Ct Abdomen Pelvis W Contrast  Result Date: 09/18/2018 CLINICAL DATA:  Right upper quadrant  abdominal pain. Clinical concern for cholecystitis. Alcohol abuse. EXAM: CT ABDOMEN AND PELVIS WITH CONTRAST TECHNIQUE: Multidetector CT imaging of the abdomen and pelvis was performed using the standard protocol following bolus administration of intravenous contrast. CONTRAST:  139mL OMNIPAQUE IOHEXOL 300 MG/ML  SOLN COMPARISON:  Limited right upper quadrant abdomen ultrasound obtained yesterday. Chest CT dated 09/16/2018. FINDINGS: Lower chest: No significant change in a pericardial effusion measuring 1.7 cm in maximum thickness. The heart remains enlarged. Moderate-sized left and small to moderate-sized right pleural effusions are again demonstrated with partial loculation on right. Associated bilateral lower lobe compressive atelectasis is also again demonstrated. Hepatobiliary: On today's contrast examination, multiple liver masses are demonstrated. The previously demonstrated caudate lobe mass measures 8.8 x 7.1 cm on image number 23 series 3. A left lobe mass measures 7.9 x 5.8 cm on image number 19 series 3 with 2 adjacent smaller masses. A right lobe mass measures 8.5 x 5.0 cm on image number 30 series 3 with an extension toward the porta hepatis. A 9 mm gallstone is demonstrated in the gallbladder  as well as smaller adjacent gallstones. No gallbladder wall thickening or pericholecystic fluid is seen. Pancreas: Unremarkable. No pancreatic ductal dilatation or surrounding inflammatory changes. Spleen: Normal in size without focal abnormality. Adrenals/Urinary Tract: Normal appearing adrenal glands, kidneys and ureters. Foley catheter in the urinary bladder with a small amount of associated air in the bladder and no significant urine. Stomach/Bowel: Large number of sigmoid colon diverticula without evidence of diverticulitis. No evidence of appendicitis. Unremarkable stomach and small bowel. Vascular/Lymphatic: Atheromatous arterial calcifications. Enlarged aortocaval lymph node with a short axis diameter of  16 mm on image number 35 series 3. Additional smaller enlarged aortocaval lymph nodes. There are also mildly enlarged retrocaval nodes, the largest with a short axis diameter 9 mm on image number 39 series 3. Mildly enlarged gastrohepatic ligament node with a short axis diameter 8 mm on image number 25 series 3. Reproductive: Prostate is unremarkable. Other: Diffuse subcutaneous edema. Minimal peritoneal fluid. Small umbilical hernia containing fat. Musculoskeletal: Proximal right femur fixation hardware. Lumbar and lower thoracic spine degenerative changes and mild scoliosis. Approximately 40% L3 vertebral compression deformity with Schmorl's node formation inferiorly with no acute fracture lines and no significant bony retropulsion. Stable approximately 15% old T12 superior endplate compression deformity. IMPRESSION: 1. Multiple liver masses. Most of these are large. Differential considerations include multifocal hepatocellular carcinoma and metastases. 2. Mild retroperitoneal and gastrohepatic ligament adenopathy suspicious for metastatic adenopathy. 3. Stable moderate-sized left and small to moderate-sized right pleural effusions with partial loculation on the right. 4. Stable moderate-sized pericardial effusion. 5. Cholelithiasis without evidence of acute cholecystitis. 6. Diffuse subcutaneous edema and minimal free peritoneal fluid. 7. Extensive sigmoid diverticulosis. Electronically Signed   By: Claudie Revering M.D.   On: 09/18/2018 16:54   US Abdomen Limited  Result Date: 09/17/2018 CLINICAL DATA:  Liver mass. EXAM: ULTRASOUND ABDOMEN LIMITED RIGHT UPPER QUADRANT COMPARISON:  Chest CT 09/16/2018. FINDINGS: Gallbladder: Gallstones are evident with gallbladder wall thickness measuring 3 mm, upper normal. Sonographer reports a positive sonographic Murphy sign. Common bile duct: Diameter: 2 mm Liver: Liver parenchyma markedly heterogeneous with ill-defined areas of increased echogenicity. 7 cm central liver  lesion identified corresponding to the caudate lobe abnormality seen on recent chest CT. Portal vein is patent on color Doppler imaging with normal direction of blood flow towards the liver. IMPRESSION: 1. Cholelithiasis with borderline gallbladder wall thickening and positive sonographic Murphy sign. Acute cholecystitis a distinct concern. 2. Heterogeneous liver parenchyma with central liver mass corresponding to caudate lobe abnormality seen on recent chest CT. Dedicated abdomen and pelvis CT with oral and intravenous contrast recommended to further evaluate. Electronically Signed   By: Misty Stanley M.D.   On: 09/17/2018 21:16   US Venous Img Lower Bilateral  Result Date: 09/15/2018 CLINICAL DATA:  Bilateral lower extremity edema. History of recent hospitalization. Evaluate for DVT. EXAM: BILATERAL LOWER EXTREMITY VENOUS DOPPLER ULTRASOUND TECHNIQUE: Gray-scale sonography with graded compression, as well as color Doppler and duplex ultrasound were performed to evaluate the lower extremity deep venous systems from the level of the common femoral vein and including the common femoral, femoral, profunda femoral, popliteal and calf veins including the posterior tibial, peroneal and gastrocnemius veins when visible. The superficial great saphenous vein was also interrogated. Spectral Doppler was utilized to evaluate flow at rest and with distal augmentation maneuvers in the common femoral, femoral and popliteal veins. COMPARISON:  None. FINDINGS: RIGHT LOWER EXTREMITY Common Femoral Vein: No evidence of thrombus. Normal compressibility, respiratory phasicity and response to augmentation. Saphenofemoral Junction:  No evidence of thrombus. Normal compressibility and flow on color Doppler imaging. Profunda Femoral Vein: No evidence of thrombus. Normal compressibility and flow on color Doppler imaging. Femoral Vein: No evidence of thrombus. Normal compressibility, respiratory phasicity and response to augmentation.  Popliteal Vein: No evidence of thrombus. Normal compressibility, respiratory phasicity and response to augmentation. Calf Veins: No evidence of thrombus. Normal compressibility and flow on color Doppler imaging. Superficial Great Saphenous Vein: No evidence of thrombus. Normal compressibility. Venous Reflux:  None. Other Findings: There is a large amount of subcutaneous edema the level of the right lower leg and calf. Note is made of a serpiginous approximately 1.6 x 5.1 x 2.7 cm fluid collection within the right popliteal fossa favored to represent a Baker cyst. LEFT LOWER EXTREMITY Common Femoral Vein: No evidence of thrombus. Normal compressibility, respiratory phasicity and response to augmentation. Saphenofemoral Junction: No evidence of thrombus. Normal compressibility and flow on color Doppler imaging. Profunda Femoral Vein: No evidence of thrombus. Normal compressibility and flow on color Doppler imaging. Femoral Vein: No evidence of thrombus. Normal compressibility, respiratory phasicity and response to augmentation. Popliteal Vein: No evidence of thrombus. Normal compressibility, respiratory phasicity and response to augmentation. Calf Veins: No evidence of thrombus. Normal compressibility and flow on color Doppler imaging. Superficial Great Saphenous Vein: No evidence of thrombus. Normal compressibility. Venous Reflux:  None. Other Findings: There is a large amount of subcutaneous edema at the level the left lower leg and calf. IMPRESSION: No evidence of DVT within either lower extremity. Electronically Signed   By: Sandi Mariscal M.D.   On: 09/15/2018 11:44   Dg Chest Port 1 View  Result Date: 09/09/2018 CLINICAL DATA:  Weakness and decreased energy for 6 months. EXAM: PORTABLE CHEST 1 VIEW COMPARISON:  None. FINDINGS: There is cardiomegaly without edema. Small to moderate pleural effusions are present, greater on the right. There is also airspace disease in the right mid and lower lung zones and left  lung base. Aortic atherosclerosis is noted. No acute or focal bony abnormality. Heterotopic ossification adjacent to the proximal left humerus is partially imaged and may be due to old fracture. IMPRESSION: Right greater than left effusions and airspace disease which could be atelectasis or pneumonia. Cardiomegaly without edema. Atherosclerosis. Electronically Signed   By: Inge Rise M.D.   On: 09/09/2018 14:35   US Thoracentesis Asp Pleural Space W/img Guide  Result Date: 09/17/2018 INDICATION: Patient with history of alcohol abuse, cough, dyspnea, left pleural effusion. Request made for diagnostic and therapeutic left thoracentesis. EXAM: ULTRASOUND GUIDED DIAGNOSTIC AND THERAPEUTIC LEFT THORACENTESIS MEDICATIONS: None COMPLICATIONS: None immediate. PROCEDURE: An ultrasound guided thoracentesis was thoroughly discussed with the patient and questions answered. The benefits, risks, alternatives and complications were also discussed. The patient understands and wishes to proceed with the procedure. Written consent was obtained. Ultrasound was performed to localize and mark an adequate pocket of fluid in the left chest. The area was then prepped and draped in the normal sterile fashion. 1% Lidocaine was used for local anesthesia. Under ultrasound guidance a 6 Fr Safe-T-Centesis catheter was introduced. Thoracentesis was performed. The catheter was removed and a dressing applied. FINDINGS: A total of approximately 710 cc of yellow fluid was removed. Samples were sent to the laboratory as requested by the clinical team. IMPRESSION: Successful ultrasound guided diagnostic and therapeutic left thoracentesis yielding 710 cc of pleural fluid. Read by: Rowe Robert, PA-C Electronically Signed   By: Jacqulynn Cadet M.D.   On: 09/17/2018 15:50    Labs:  CBC: Recent Labs    09/11/18 0205 09/12/18 0301 09/15/18 1203 09/18/18 0428  WBC 7.3 7.3 6.9 6.8  HGB 8.1* 8.3* 9.8* 9.7*  HCT 26.1* 26.8* 30.6*  32.7*  PLT 205 212 207.0 210    COAGS: Recent Labs    09/09/18 1417 09/18/18 0428  INR 1.80 1.96    BMP: Recent Labs    09/17/18 0510 09/18/18 0428 09/18/18 1627 09/19/18 0457  NA 125* 127* 126* 128*  K 3.4* 2.9* 3.7 3.9  CL 97* 94* 93* 94*  CO2 24 26 26 30   GLUCOSE 73 80 75 85  BUN <5* <5* <5* <5*  CALCIUM 7.0* 7.3* 7.1* 7.3*  CREATININE 0.45* 0.43* 0.43* 0.44*  GFRNONAA >60 >60 >60 >60  GFRAA >60 >60 >60 >60    LIVER FUNCTION TESTS: Recent Labs    09/11/18 0205 09/12/18 0301 09/15/18 1203 09/18/18 0428  BILITOT 2.6* 1.7* 2.1* 2.1*  AST 18 18 25 26   ALT 12 11 11 12   ALKPHOS 69 66 84 92  PROT 6.2* 5.9* 6.3 5.6*  ALBUMIN 1.8* 1.7* 2.1* 1.8*    TUMOR MARKERS: No results for input(s): AFPTM, CEA, CA199, CHROMGRNA in the last 8760 hours.  Assessment and Plan: Liver masses Reviewed with Dr. Anselm Pancoast, amenable to US guided biopsy. INR elevated, likely due to intrinsic liver disease. Will recheck in am, but may need FFP prior to biopsy. Gregory Rowe mental status is not clear enough that I think he understands the plan for liver biopsy. Will tentatively plan for tomorrow, but will probably need to track his family down unless his mental status improves.    Thank you for this interesting consult.  I greatly enjoyed meeting Gregory Rowe and look forward to participating in their care.  A copy of this report was sent to the requesting provider on this date.  Electronically Signed: Ascencion Dike, PA-C 09/19/2018, 11:49 AM   I spent a total of 20 minutes in face to face in clinical consultation, greater than 50% of which was counseling/coordinating care for liver biopsy
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
## 2018-09-19 NOTE — Progress Notes (Signed)
Gregory Rowe  [phone]) [phone] [phone]) [phone]     ID: Gregory Rowe DOB: November 05, 1955  Gregory#: [id number]  JYN#:[id number]  Patient Care Team: Patient, No Pcp Per as PCP - General (General Practice) Gregory Cruel, MD OTHER MD:  CHIEF COMPLAINT: liver masses  CURRENT TREATMENT: awaiting biopsy   HISTORY OF CURRENT ILLNESS: Gregory Rowe is a 61 y/o [place] man who presented to the ED here 09/09/2018 with c/o shortness of breath and swelling. He was found to have a Hb of 3.0, MCV 65.1, ferritin 6 and reticulocytes 44. He gave no history of melena or BRBPR and was guaiac negative. He was transfused PRBCs x5 while being worked up for multiple other problems including alcoholism (primarily beer), anasarca, electrolyte imbalances, malnutrition and bilateral pleural effusions. He was discharged 09/12/2018 with plans for outpatient GI workup for occult GIB  However on 09/15/2018 he again presented to the ED, referred by his PCP for evaluation of hyponatremia and CXR changes. CT chest 09/16/2018 showed bilateral effusions,L>R, with possible pneumonia or mass underlying the left side fluid. Enlargement of the caudate lobe of the liver was incidentally noted.  On 09/17/2018 he underwent left thoracentesis, resulting in 710 cc yellow fluid with a glc 81, LDH 70, WBC 390, mostly "monocytes," and cytology pending.  The liver issue was evaluated with a C-Met which showed normal transaminases and alk phos, t bil 2.1. CT abd/pelvis w/c 09/18/2018 showed multiple liver masses involving all lobes of the liver. There was mild aortocaval, retrocaval and gastrohepatic adenopathy. Spleen, adrenals and pancreas were unremarkable. AFP and CEA were ordered and are pending.  The patient's subsequent history is as detailed below.  INTERVAL HISTORY: I met with Gregory Rowe in his room 09/19/2018; no family in room.  REVIEW OF SYSTEMS: Patient is very lethargic. Asked why he's in the  hospital he says "I can't get a cup of water." He denies pain, nausea or vomiting; is not aware of any bleeding. Not aware of falls despite rib fractures noted on CT. A detailed ROS could not otherwise be obtained.  PAST MEDICAL HISTORY: Past Medical History:  Diagnosis Date  . Alcoholism ([place])   . Anemia   . Hyponatremia     PAST SURGICAL HISTORY: History reviewed. No pertinent surgical history.  S/p R femoral fixation  FAMILY HISTORY History reviewed. No pertinent family history. Unable to obtain   SOCIAL HISTORY:  Patient tells me he lives in [place] with his SO of 20 years, [place]. He was unable to tell me what he does or did for a living or what she does for a living. He has two children Gregory Rowe and Gregory Rowe. "Gregory Rowe is the one in charge. Talk to him"    ADVANCED DIRECTIVES: full code at present   HEALTH MAINTENANCE: Social History   Tobacco Use  . Smoking status: Never Smoker  . Smokeless tobacco: Current User    Types: Snuff  Substance Use Topics  . Alcohol use: Yes    Alcohol/week: 4.0 - 5.0 standard drinks    Types: 4 - 5 Cans of beer per week  . Drug use: Never     Colonoscopy:  PAP:  Bone density:   Allergies  Allergen Reactions  . Other Other (See Comments)    NARCOTICS; makes him angry    Current Facility-Administered Medications  Medication Dose Route Frequency Provider Last Rate Last Dose  . 0.9 %  sodium chloride infusion   Intravenous PRN Cardama, Grayce Sessions, MD  Stopped at 09/18/18 1154  . acetaminophen (TYLENOL) tablet 650 mg  650 mg Oral Q6H PRN Annita Brod, MD   650 mg at 09/18/18 2125   Or  . acetaminophen (TYLENOL) suppository 650 mg  650 mg Rectal Q6H PRN Annita Brod, MD      . benzonatate (TESSALON) capsule 100 mg  100 mg Oral TID PRN Florencia Reasons, MD   100 mg at 09/17/18 1741  . cholecalciferol (VITAMIN D) tablet 1,000 Units  1,000 Units Oral Daily Florencia Reasons, MD   1,000 Units at 09/18/18 1025  . feeding supplement  (ENSURE ENLIVE) (ENSURE ENLIVE) liquid 237 mL  237 mL Oral BID BM Annita Brod, MD   237 mL at 09/18/18 1855  . folic acid (FOLVITE) tablet 1 mg  1 mg Oral Daily Gevena Barre K, MD   1 mg at 09/18/18 1025  . furosemide (LASIX) injection 20 mg  20 mg Intravenous Q12H Annita Brod, MD   20 mg at 09/19/18 0717  . LORazepam (ATIVAN) tablet 1 mg  1 mg Oral Q6H PRN Annita Brod, MD       Or  . LORazepam (ATIVAN) injection 1 mg  1 mg Intravenous Q6H PRN Annita Brod, MD   1 mg at 09/18/18 2244  . multivitamin with minerals tablet 1 tablet  1 tablet Oral Daily Annita Brod, MD   1 tablet at 09/18/18 1025  . ondansetron (ZOFRAN) tablet 4 mg  4 mg Oral Q6H PRN Annita Brod, MD   4 mg at 09/18/18 1025   Or  . ondansetron (ZOFRAN) injection 4 mg  4 mg Intravenous Q6H PRN Annita Brod, MD      . spironolactone (ALDACTONE) tablet 100 mg  100 mg Oral Daily Florencia Reasons, MD   100 mg at 09/18/18 1025  . thiamine (VITAMIN B-1) tablet 100 mg  100 mg Oral Daily Annita Brod, MD   100 mg at 09/18/18 1025   Or  . thiamine (B-1) injection 100 mg  100 mg Intravenous Daily Annita Brod, MD      . vitamin B-12 (CYANOCOBALAMIN) tablet 1,000 mcg  1,000 mcg Oral Daily Florencia Reasons, MD   1,000 mcg at 09/18/18 1025    OBJECTIVE: middle aged [place] man who appears older than stated age, examined in bed  Vitals:   09/19/18 0404 09/19/18 0600  BP: 105/60   Pulse: 87   Resp:    Temp: 97.7 F (36.5 C) 98.9 F (37.2 C)  SpO2: 96%      Body mass index is 29.57 kg/m.   Wt Readings from Last 3 Encounters:  09/19/18 183 lb 3.2 oz (83.1 kg)  09/11/18 183 lb 3.2 oz (83.1 kg)   Ocular: Sclerae unicteric Lungs no rales or rhonchi, auscultated anterolaterally Heart regular rate and rhythm Abd soft, nontender Neuro: lethargic, arouses easily but falls back to sleep  LAB RESULTS:  CMP     Component Value Date/Time   NA 128 (L) 09/19/2018 0457   K 3.9 09/19/2018 0457    CL 94 (L) 09/19/2018 0457   CO2 30 09/19/2018 0457   GLUCOSE 85 09/19/2018 0457   BUN <5 (L) 09/19/2018 0457   CREATININE 0.44 (L) 09/19/2018 0457   CALCIUM 7.3 (L) 09/19/2018 0457   PROT 5.6 (L) 09/18/2018 0428   ALBUMIN 1.8 (L) 09/18/2018 0428   AST 26 09/18/2018 0428   ALT 12 09/18/2018 0428   ALKPHOS 92 09/18/2018 0428  BILITOT 2.1 (H) 09/18/2018 0428   GFRNONAA >60 09/19/2018 0457   GFRAA >60 09/19/2018 0457    No results found for: TOTALPROTELP, ALBUMINELP, A1GS, A2GS, BETS, BETA2SER, GAMS, MSPIKE, SPEI  No results found for: KPAFRELGTCHN, LAMBDASER, [facility]  Lab Results  Component Value Date   WBC 6.8 09/18/2018   NEUTROABS 4.0 09/18/2018   HGB 9.7 (L) 09/18/2018   HCT 32.7 (L) 09/18/2018   MCV 81.3 09/18/2018   PLT 210 09/18/2018    _0 @  No results found for: LABCA2  No components found for: NOIBBC488  Recent Labs  Lab 09/18/18 0428  INR 1.96    No results found for: LABCA2  No results found for: QBV694  No results found for: HWT888  No results found for: KCM034  No results found for: CA2729  No components found for: HGQUANT  No results found for: CEA1 / No results found for: CEA1   No results found for: AFPTUMOR  No results found for: CHROMOGRNA  No results found for: PSA1  Admission on 09/15/2018  Component Date Value Ref Range Status  . Sodium 09/16/2018 122* 135 - 145 mmol/L Final  . Potassium 09/16/2018 2.8* 3.5 - 5.1 mmol/L Final  . Chloride 09/16/2018 94* 98 - 111 mmol/L Final  . CO2 09/16/2018 22  22 - 32 mmol/L Final  . Glucose, Bld 09/16/2018 82  70 - 99 mg/dL Final  . BUN 09/16/2018 <5* 8 - 23 mg/dL Final  . Creatinine, Ser 09/16/2018 0.36* 0.61 - 1.24 mg/dL Final  . Calcium 09/16/2018 6.5* 8.9 - 10.3 mg/dL Final  . GFR calc non Af Amer 09/16/2018 >60  >60 mL/min Final  . GFR calc Af Amer 09/16/2018 >60  >60 mL/min Final   Comment: (NOTE) The eGFR has been calculated using the CKD EPI equation. This  calculation has not been validated in all clinical situations. eGFR's persistently <60 mL/min signify possible Chronic Kidney Disease.   Georgiann Hahn gap 09/16/2018 6  5 - 15 Final   Performed at [facility], [place] [place], [place] [address]  . Sodium 09/16/2018 123* 135 - 145 mmol/L Final  . Potassium 09/16/2018 3.6  3.5 - 5.1 mmol/L Final  . Chloride 09/16/2018 95* 98 - 111 mmol/L Final  . CO2 09/16/2018 22  22 - 32 mmol/L Final  . Glucose, Bld 09/16/2018 75  70 - 99 mg/dL Final  . BUN 09/16/2018 <5* 8 - 23 mg/dL Final  . Creatinine, Ser 09/16/2018 0.41* 0.61 - 1.24 mg/dL Final  . Calcium 09/16/2018 6.8* 8.9 - 10.3 mg/dL Final  . GFR calc non Af Amer 09/16/2018 >60  >60 mL/min Final  . GFR calc Af Amer 09/16/2018 >60  >60 mL/min Final   Comment: (NOTE) The eGFR has been calculated using the CKD EPI equation. This calculation has not been validated in all clinical situations. eGFR's persistently <60 mL/min signify possible Chronic Kidney Disease.   Georgiann Hahn gap 09/16/2018 6  5 - 15 Final   Performed at [facility], [place] [address] [place], [place] [address]  . Procalcitonin 09/16/2018 0.28  ng/mL Final   Comment:        Interpretation: PCT (Procalcitonin) <= 0.5 ng/mL: Systemic infection (sepsis) is not likely. Local bacterial infection is possible. (NOTE)       Sepsis PCT Algorithm           Lower Respiratory Tract  Infection PCT Algorithm    ----------------------------     ----------------------------         PCT < 0.25 ng/mL                PCT < 0.10 ng/mL         Strongly encourage             Strongly discourage   discontinuation of antibiotics    initiation of antibiotics    ----------------------------     -----------------------------       PCT 0.25 - 0.50 ng/mL            PCT 0.10 - 0.25 ng/mL               OR       >80% decrease in PCT            Discourage initiation of                                             antibiotics      Encourage discontinuation           of antibiotics    ----------------------------     -----------------------------         PCT >= 0.50 ng/mL              PCT 0.26 - 0.50 ng/mL               AND                                 <80% decrease in PCT             Encourage initiation of                                             antibiotics       Encourage continuation           of antibiotics    ----------------------------     -----------------------------        PCT >= 0.50 ng/mL                  PCT > 0.50 ng/mL               AND         increase in PCT                  Strongly encourage                                      initiation of antibiotics    Strongly encourage escalation           of antibiotics                                     -----------------------------  PCT <= 0.25 ng/mL                                                 OR                                        > 80% decrease in PCT                                     Discontinue / Do not initiate                                             antibiotics Performed at [place] [address] [place], [place] [address]   . Sodium 09/17/2018 125* 135 - 145 mmol/L Final  . Potassium 09/17/2018 3.4* 3.5 - 5.1 mmol/L Final  . Chloride 09/17/2018 97* 98 - 111 mmol/L Final  . CO2 09/17/2018 24  22 - 32 mmol/L Final  . Glucose, Bld 09/17/2018 73  70 - 99 mg/dL Final  . BUN 09/17/2018 <5* 8 - 23 mg/dL Final  . Creatinine, Ser 09/17/2018 0.45* 0.61 - 1.24 mg/dL Final  . Calcium 09/17/2018 7.0* 8.9 - 10.3 mg/dL Final  . GFR calc non Af Amer 09/17/2018 >60  >60 mL/min Final  . GFR calc Af Amer 09/17/2018 >60  >60 mL/min Final   Comment: (NOTE) The eGFR has been calculated using the CKD EPI equation. This calculation has not been validated in all clinical situations. eGFR's persistently <60 mL/min signify possible Chronic  Kidney Disease.   Georgiann Hahn gap 09/17/2018 4* 5 - 15 Final   Performed at [facility], [place] [address] [place], [place] [address]  . Magnesium 09/17/2018 1.6* 1.7 - 2.4 mg/dL Final   Performed at [place] [address], [place], [place] [address]  . Sodium, Ur 09/17/2018 59  mmol/L Final   Performed at [facility], [place] [address], [place], [place] [address]  . Uric Acid, Urine 09/17/2018 3.5  Not Estab. mg/dL Final   Comment: (NOTE) Performed At: [facility] [place], [place] [id number] Rush Farmer MD LZ:[id number]   . Osmolality, Ur 09/17/2018 203* 300 - 900 mOsm/kg Final   Performed at [place] [address], [place], [place] [address]  . Osmolality 09/17/2018 257* 275 - 295 mOsm/kg Final   Performed at [facility], [place] [address], [place], [place] [address]  . LD, Fluid 09/17/2018 70* 3 - 23 U/L Final   Comment: (NOTE) Results should be evaluated in conjunction with serum values   . Fluid Type-FLDH 09/17/2018 Pleural, L   Final   Performed at [facility], [place] [address], [place], [place] [address]  . Fluid Type-FCT 09/17/2018 Pleural, L   Final  . Color, Fluid 09/17/2018 YELLOW  YELLOW Final  . Appearance, Fluid 09/17/2018 CLEAR  CLEAR Final  . WBC, Fluid 09/17/2018 390  0 - 1,000 cu mm Final  . Neutrophil Count, Fluid 09/17/2018 8  0 - 25 %  Final  . Lymphs, Fluid 09/17/2018 11  % Final  . Monocyte-Macrophage-Serous Fluid 09/17/2018 81  50 - 90 % Final  . Eos, Fluid 09/17/2018 0  % Final  . Other Cells, Fluid 09/17/2018 OTHER CELLS IDENTIFIED AS MESOTHELIAL CELLS  % Final   Comment: CORRELATE WITH CYTOLOGY. Performed at [facility], [place] [address], [place], [place] [address]   . Specimen Description 09/17/2018    Final                   Value:Pleural, L Performed at [facility], [place] [address],  [place], [place] [address]   . Special Requests 09/17/2018    Final                   Value:NONE Performed at [facility], [place] [address], [place], [place] [address]   . Gram Stain 09/17/2018    Final                   Value:MODERATE WBC PRESENT, PREDOMINANTLY MONONUCLEAR NO ORGANISMS SEEN   . Culture 09/17/2018    Final                   Value:NO GROWTH < 24 HOURS Performed at [facility], [place] [address] [place], [place] [address]   . Report Status 09/17/2018 PENDING   Incomplete  . Total protein, fluid 09/17/2018 (NOTE)  g/dL Final   Comment: No normal range established for this test Results should be evaluated in conjunction with serum values   . Fluid Type-FTP 09/17/2018 Pleural, L   Final   Performed at [facility], [place] [address] [place], [place] [address]  . Glucose, Fluid 09/17/2018 81  mg/dL Final   Comment: (NOTE) No normal range established for this test Results should be evaluated in conjunction with serum values   . Fluid Type-FGLU 09/17/2018 Pleural, L   Final   Performed at [facility], [place] [address], [place], [place] [address]  . WBC 09/18/2018 6.8  4.0 - 10.5 K/uL Final  . RBC 09/18/2018 4.02* 4.22 - 5.81 MIL/uL Final  . Hemoglobin 09/18/2018 9.7* 13.0 - 17.0 g/dL Final  . HCT 09/18/2018 32.7* 39.0 - 52.0 % Final  . MCV 09/18/2018 81.3  80.0 - 100.0 fL Final  . MCH 09/18/2018 24.1* 26.0 - 34.0 pg Final  . MCHC 09/18/2018 29.7* 30.0 - 36.0 g/dL Final  . RDW 09/18/2018 28.3* 11.5 - 15.5 % Final  . Platelets 09/18/2018 210  150 - 400 K/uL Final  . nRBC 09/18/2018 0.0  0.0 - 0.2 % Final  . Neutrophils Relative % 09/18/2018 59  % Final  . Neutro Abs 09/18/2018 4.0  1.7 - 7.7 K/uL Final  . Lymphocytes Relative 09/18/2018 20  % Final  . Lymphs Abs 09/18/2018 1.4  0.7 - 4.0 K/uL Final  . Monocytes Relative 09/18/2018 14  % Final  . Monocytes Absolute 09/18/2018 0.9  0.1 - 1.0 K/uL Final  . Eosinophils  Relative 09/18/2018 6  % Final  . Eosinophils Absolute 09/18/2018 0.4  0.0 - 0.5 K/uL Final  . Basophils Relative 09/18/2018 1  % Final  . Basophils Absolute 09/18/2018 0.1  0.0 - 0.1 K/uL Final  . Immature Granulocytes 09/18/2018 0  % Final  . Abs Immature Granulocytes 09/18/2018 0.03  0.00 - 0.07 K/uL Final   Performed at [facility], [place] [address] [place], [place] [address]  .  Sodium 09/18/2018 127* 135 - 145 mmol/L Final  . Potassium 09/18/2018 2.9* 3.5 - 5.1 mmol/L Final  . Chloride 09/18/2018 94* 98 - 111 mmol/L Final  . CO2 09/18/2018 26  22 - 32 mmol/L Final  . Glucose, Bld 09/18/2018 80  70 - 99 mg/dL Final  . BUN 09/18/2018 <5* 8 - 23 mg/dL Final  . Creatinine, Ser 09/18/2018 0.43* 0.61 - 1.24 mg/dL Final  . Calcium 09/18/2018 7.3* 8.9 - 10.3 mg/dL Final  . Total Protein 09/18/2018 5.6* 6.5 - 8.1 g/dL Final  . Albumin 09/18/2018 1.8* 3.5 - 5.0 g/dL Final  . AST 09/18/2018 26  15 - 41 U/L Final  . ALT 09/18/2018 12  0 - 44 U/L Final  . Alkaline Phosphatase 09/18/2018 92  38 - 126 U/L Final  . Total Bilirubin 09/18/2018 2.1* 0.3 - 1.2 mg/dL Final  . GFR calc non Af Amer 09/18/2018 >60  >60 mL/min Final  . GFR calc Af Amer 09/18/2018 >60  >60 mL/min Final   Comment: (NOTE) The eGFR has been calculated using the CKD EPI equation. This calculation has not been validated in all clinical situations. eGFR's persistently <60 mL/min signify possible Chronic Kidney Disease.   Georgiann Hahn gap 09/18/2018 7  5 - 15 Final   Performed at [facility], [place] [address], [place], [place] [address]  . Magnesium 09/18/2018 1.6* 1.7 - 2.4 mg/dL Final   Performed at [place] [address], [place], [place] [address]  . Specimen Description 09/17/2018 EXPECTORATED SPUTUM   Final  . Special Requests 09/17/2018 NONE   Final  . Sputum evaluation 09/17/2018    Final                   Value:Sputum specimen not acceptable for testing.   Please recollect.   NOTIFIED L.PATRAM AT 1941 ON 09/17/18 BY N.THOMPSON Performed at [facility], [place] [address], [place], [place] [address]   . Report Status 09/17/2018 09/17/2018 FINAL   Final  . MRSA by PCR 09/17/2018 NEGATIVE  NEGATIVE Final   Comment:        The GeneXpert MRSA Assay (FDA approved for NASAL specimens only), is one component of a comprehensive MRSA colonization surveillance program. It is not intended to diagnose MRSA infection nor to guide or monitor treatment for MRSA infections. Performed at [facility], [place] [address] [place], [place] [address]   . Prothrombin Time 09/18/2018 22.1* 11.4 - 15.2 seconds Final  . INR 09/18/2018 1.96   Final   Performed at [facility], [place] [address], [place], [place] [address]  . TSH 09/18/2018 4.866* 0.350 - 4.500 uIU/mL Final   Comment: Performed by a 3rd Generation assay with a functional sensitivity of <=0.01 uIU/mL. Performed at [facility], [place] [address] [place] [address] [place], [place] [address]   . Cortisol, Plasma 09/18/2018 5.6  ug/dL Final   Comment: (NOTE) AM    6.7 - 22.6 ug/dL PM   <10.0       ug/dL Performed at [facility], [place] [address] [place],  [place] [address]   . Strep Pneumo Urinary Antigen 09/17/2018 NEGATIVE  NEGATIVE Final   Comment:        Infection due to S. pneumoniae cannot be absolutely ruled out since the antigen present may be below the detection limit of the test.   . Color, Urine 09/18/2018 AMBER* YELLOW Final   BIOCHEMICALS MAY BE AFFECTED BY COLOR  .  APPearance 09/18/2018 HAZY* CLEAR Final  . Specific Gravity, Urine 09/18/2018 1.011  1.005 - 1.030 Final  . pH 09/18/2018 5.0  5.0 - 8.0 Final  . Glucose, UA 09/18/2018 NEGATIVE  NEGATIVE mg/dL Final  . Hgb urine dipstick 09/18/2018 SMALL* NEGATIVE Final  . Bilirubin Urine 09/18/2018 NEGATIVE  NEGATIVE Final  . Ketones, ur 09/18/2018 5* NEGATIVE mg/dL Final    . Protein, ur 09/18/2018 NEGATIVE  NEGATIVE mg/dL Final  . Nitrite 09/18/2018 NEGATIVE  NEGATIVE Final  . Leukocytes, UA 09/18/2018 TRACE* NEGATIVE Final  . RBC / HPF 09/18/2018 6-10  0 - 5 RBC/hpf Final  . WBC, UA 09/18/2018 6-10  0 - 5 WBC/hpf Final  . Bacteria, UA 09/18/2018 RARE* NONE SEEN Final  . Mucus 09/18/2018 PRESENT   Final  . Hyaline Casts, UA 09/18/2018 PRESENT   Final   Performed at [facility], [place] [address] [place], [place] [address]  . Specimen Description 09/18/2018 SPUTUM   Final  . Special Requests 09/18/2018 NONE   Final  . Sputum evaluation 09/18/2018    Final                   Value:THIS SPECIMEN IS ACCEPTABLE FOR SPUTUM CULTURE Performed at [facility], [place] [address], [place], [place] [address]   . Report Status 09/18/2018 09/18/2018 FINAL   Final  . Specimen Description 09/18/2018    Final                   Value:SPUTUM Performed at [facility], [place] [address], [place], [place] [address]   . Special Requests 09/18/2018    Final                   Value:NONE Reflexed from [id number] Performed at [facility], [place] [address] [place], [place] [address]   . Gram Stain 09/18/2018    Final                   Value:FEW SQUAMOUS EPITHELIAL CELLS PRESENT NO WBC SEEN RARE GRAM POSITIVE COCCI Performed at [facility], [place] [address], [place], [place] [address]   . Culture 09/18/2018 PENDING   Incomplete  . Report Status 09/18/2018 PENDING   Incomplete  . Sodium 09/18/2018 126* 135 - 145 mmol/L Final  . Potassium 09/18/2018 3.7  3.5 - 5.1 mmol/L Final   DELTA CHECK NOTED  . Chloride 09/18/2018 93* 98 - 111 mmol/L Final  . CO2 09/18/2018 26  22 - 32 mmol/L Final  . Glucose, Bld 09/18/2018 75  70 - 99 mg/dL Final  . BUN 09/18/2018 <5* 8 - 23 mg/dL Final  . Creatinine, Ser 09/18/2018 0.43* 0.61 - 1.24 mg/dL Final  . Calcium 09/18/2018 7.1* 8.9 - 10.3 mg/dL Final  . GFR calc non Af  Amer 09/18/2018 >60  >60 mL/min Final  . GFR calc Af Amer 09/18/2018 >60  >60 mL/min Final   Comment: (NOTE) The eGFR has been calculated using the CKD EPI equation. This calculation has not been validated in all clinical situations. eGFR's persistently <60 mL/min signify possible Chronic Kidney Disease.   Georgiann Hahn gap 09/18/2018 7  5 - 15 Final   Performed at [facility], [place] [address] [place], [place] [address]  . Magnesium 09/18/2018 1.6* 1.7 - 2.4 mg/dL Final   Performed at [place] [address], [place],  [address]  . Sodium 09/19/2018 128* 135 -  145 mmol/L Final  . Potassium 09/19/2018 3.9  3.5 - 5.1 mmol/L Final  . Chloride 09/19/2018 94* 98 - 111 mmol/L Final  . CO2 09/19/2018 30  22 - 32 mmol/L Final  . Glucose, Bld 09/19/2018 85  70 - 99 mg/dL Final  . BUN 09/19/2018 <5* 8 - 23 mg/dL Final   RESULT CHECKED  . Creatinine, Ser 09/19/2018 0.44* 0.61 - 1.24 mg/dL Final  . Calcium 09/19/2018 7.3* 8.9 - 10.3 mg/dL Final  . GFR calc non Af Amer 09/19/2018 >60  >60 mL/min Final  . GFR calc Af Amer 09/19/2018 >60  >60 mL/min Final   Comment: (NOTE) The eGFR has been calculated using the CKD EPI equation. This calculation has not been validated in all clinical situations. eGFR's persistently <60 mL/min signify possible Chronic Kidney Disease.   Georgiann Hahn gap 09/19/2018 4* 5 - 15 Final   Performed at [facility], [place] [address], [place], [place] [address]  . Magnesium 09/19/2018 2.1  1.7 - 2.4 mg/dL Final   Performed at [place] [address] [place], [place] [address]  . Fecal Occult Bld 09/19/2018 POSITIVE* NEGATIVE Final   Performed at [facility], [place] [address] [place], [place] [address]    (this displays the last labs from the last 3 days)  No results found for: TOTALPROTELP, ALBUMINELP, A1GS, A2GS, BETS, BETA2SER, GAMS, MSPIKE, SPEI (this displays SPEP  labs)  No results found for: KPAFRELGTCHN, LAMBDASER, KAPLAMBRATIO (kappa/lambda light chains)  No results found for: HGBA, HGBA2QUANT, HGBFQUANT, HGBSQUAN (Hemoglobinopathy evaluation)   No results found for: LDH  Lab Results  Component Value Date   IRON 114 09/09/2018   TIBC 295 09/09/2018   IRONPCTSAT 39 09/09/2018   (Iron and TIBC)  Lab Results  Component Value Date   FERRITIN 33 09/12/2018    Urinalysis    Component Value Date/Time   COLORURINE AMBER (A) 09/18/2018 0827   APPEARANCEUR HAZY (A) 09/18/2018 0827   LABSPEC 1.011 09/18/2018 0827   PHURINE 5.0 09/18/2018 0827   GLUCOSEU NEGATIVE 09/18/2018 0827   HGBUR SMALL (A) 09/18/2018 0827   BILIRUBINUR NEGATIVE 09/18/2018 0827   KETONESUR 5 (A) 09/18/2018 0827   PROTEINUR NEGATIVE 09/18/2018 0827   NITRITE NEGATIVE 09/18/2018 0827   LEUKOCYTESUR TRACE (A) 09/18/2018 0827     STUDIES: Dg Chest 1 View  Result Date: 09/17/2018 CLINICAL DATA:  Status post LEFT thoracentesis. EXAM: CHEST  1 VIEW COMPARISON:  Chest x-ray dated 09/15/2018. FINDINGS: Significantly improved aeration at the LEFT lung base status post thoracentesis. No pneumothorax seen. Patchy opacities at the RIGHT lung base, mostly due to loculated pleural effusion within the RIGHT lung fissure as demonstrated on chest CT of 09/16/2018. Heart size and mediastinal contours appear grossly stable. 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 IMPRESSION: 1. Significantly improved aeration at the LEFT lung base status post thoracentesis. No pneumothorax. 2. 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 Electronically Signed   By: Franki Cabot M.D.   On: 09/17/2018 15:52   Dg Chest 2 View  Result Date: 09/15/2018 CLINICAL DATA:  Recent hospitalization for transfusions secondary to anemia. History of fluid on the lungs. EXAM: CHEST - 2 VIEW  COMPARISON:  Portable chest x-ray of September 09, 2018 FINDINGS: There is increased volume loss on the left with moderate sized left pleural effusion. Progressive atelectasis or pneumonia in the left lower  lobe is present. On the right pleural fluid has filled the major fissure and some atelectasis medially persists. The cardiac silhouette is enlarged. The pulmonary vascularity is not clearly engorged. There is calcification in the wall of the aortic arch. There is calcification of the anterior longitudinal ligament of the thoracic spine. IMPRESSION: Increased density in the left retrocardiac region with moderate-sized left pleural effusion. Atelectasis or pneumonia here is suspected. Probable loculated fluid along the major fissure on the right medially. Chest CT scanning is recommended to better evaluate the pulmonary parenchyma and the pleural spaces. Electronically Signed   By: David  Martinique M.D.   On: 09/15/2018 11:38   Ct Chest Wo Contrast  Result Date: 09/16/2018 CLINICAL DATA:  Acute onset of shortness of breath on exertion. Hypokalemia and hyponatremia. Anemia. Right anterior rib pain. Assess left-sided pleural effusion. EXAM: CT CHEST WITHOUT CONTRAST TECHNIQUE: Multidetector CT imaging of the chest was performed following the standard protocol without IV contrast. COMPARISON:  Chest radiograph performed 09/15/2018 FINDINGS: Cardiovascular: The heart is mildly enlarged. Scattered coronary artery calcifications are seen. Mild calcification is noted at the aortic arch and proximal great vessels. Mediastinum/Nodes: A small pericardial effusion is noted. No mediastinal lymphadenopathy is seen. The mediastinum is difficult to fully characterize without contrast. The thyroid gland is grossly unremarkable. No axillary lymphadenopathy is appreciated. Lungs/Pleura: A small right-sided pleural effusion is noted, somewhat loculated in appearance. Fluid is seen tracking along the right major fissure. There is also  a small to moderate left-sided pleural effusion, with consolidation of the left lower lobe. Pneumonia is a concern. Underlying mass cannot be excluded. Upper Abdomen: There is somewhat unusual enlargement of the caudate lobe of the liver to 7.3 cm, with associated heterogeneity. The visualized portions of the spleen are unremarkable. Stones are noted within the gallbladder; the gallbladder is largely contracted, with minimal nonspecific edema about the gallbladder. The visualized portions of the adrenal glands and kidneys are grossly unremarkable. Musculoskeletal: There is suggestion of minimally displaced fractures of the right anterior fourth through sixth ribs. There appears to be 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, incompletely imaged on this study. The visualized musculature is unremarkable in appearance. IMPRESSION: 1. Small right-sided pleural effusion, somewhat loculated in appearance. Fluid tracking along the right major fissure. Small to moderate left-sided pleural effusion, with consolidation of the left lower lobe. Pneumonia is a concern. Underlying mass cannot be excluded, given the pleural effusion. 2. Suggestion of minimally displaced fractures of the right anterior fourth through sixth ribs. 3. Small pericardial effusion noted. 4. Somewhat unusual enlargement of the caudate lobe of the liver to 7.3 cm, with associated heterogeneity. Would correlate with LFTs, and consider dynamic liver protocol MRI or CT for further evaluation, when and as deemed clinically appropriate. 5. Cholelithiasis. Gallbladder largely contracted, with minimal nonspecific edema about the gallbladder. Electronically Signed   By: Garald Balding M.D.   On: 09/16/2018 01:29   Ct Abdomen Pelvis W Contrast  Result Date: 09/18/2018 CLINICAL DATA:  Right upper quadrant abdominal pain. Clinical concern for cholecystitis. Alcohol abuse. EXAM: CT ABDOMEN AND PELVIS WITH CONTRAST TECHNIQUE: Multidetector CT imaging of  the abdomen and pelvis was performed using the standard protocol following bolus administration of intravenous contrast. CONTRAST:  116m OMNIPAQUE IOHEXOL 300 MG/ML  SOLN COMPARISON:  Limited right upper quadrant abdomen ultrasound obtained yesterday. Chest CT dated 09/16/2018. FINDINGS: Lower chest: No significant change in a pericardial effusion measuring 1.7 cm in maximum thickness. The heart remains enlarged. Moderate-sized left and small  to moderate-sized right pleural effusions are again demonstrated with partial loculation on right. Associated bilateral lower lobe compressive atelectasis is also again demonstrated. Hepatobiliary: On today's contrast examination, multiple liver masses are demonstrated. The previously demonstrated caudate lobe mass measures 8.8 x 7.1 cm on image number 23 series 3. A left lobe mass measures 7.9 x 5.8 cm on image number 19 series 3 with 2 adjacent smaller masses. A right lobe mass measures 8.5 x 5.0 cm on image number 30 series 3 with an extension toward the porta hepatis. A 9 mm gallstone is demonstrated in the gallbladder as well as smaller adjacent gallstones. No gallbladder wall thickening or pericholecystic fluid is seen. Pancreas: Unremarkable. No pancreatic ductal dilatation or surrounding inflammatory changes. Spleen: Normal in size without focal abnormality. Adrenals/Urinary Tract: Normal appearing adrenal glands, kidneys and ureters. Foley catheter in the urinary bladder with a small amount of associated air in the bladder and no significant urine. Stomach/Bowel: Large number of sigmoid colon diverticula without evidence of diverticulitis. No evidence of appendicitis. Unremarkable stomach and small bowel. Vascular/Lymphatic: Atheromatous arterial calcifications. Enlarged aortocaval lymph node with a short axis diameter of 16 mm on image number 35 series 3. Additional smaller enlarged aortocaval lymph nodes. There are also mildly enlarged retrocaval nodes, the largest  with a short axis diameter 9 mm on image number 39 series 3. Mildly enlarged gastrohepatic ligament node with a short axis diameter 8 mm on image number 25 series 3. Reproductive: Prostate is unremarkable. Other: Diffuse subcutaneous edema. Minimal peritoneal fluid. Small umbilical hernia containing fat. Musculoskeletal: Proximal right femur fixation hardware. Lumbar and lower thoracic spine degenerative changes and mild scoliosis. Approximately 40% L3 vertebral compression deformity with Schmorl's node formation inferiorly with no acute fracture lines and no significant bony retropulsion. Stable approximately 15% old T12 superior endplate compression deformity. IMPRESSION: 1. Multiple liver masses. Most of these are large. Differential considerations include multifocal hepatocellular carcinoma and metastases. 2. Mild retroperitoneal and gastrohepatic ligament adenopathy suspicious for metastatic adenopathy. 3. Stable moderate-sized left and small to moderate-sized right pleural effusions with partial loculation on the right. 4. Stable moderate-sized pericardial effusion. 5. Cholelithiasis without evidence of acute cholecystitis. 6. Diffuse subcutaneous edema and minimal free peritoneal fluid. 7. Extensive sigmoid diverticulosis. Electronically Signed   By: Claudie Revering M.D.   On: 09/18/2018 16:54   US Abdomen Limited  Result Date: 09/17/2018 CLINICAL DATA:  Liver mass. EXAM: ULTRASOUND ABDOMEN LIMITED RIGHT UPPER QUADRANT COMPARISON:  Chest CT 09/16/2018. FINDINGS: Gallbladder: Gallstones are evident with gallbladder wall thickness measuring 3 mm, upper normal. Sonographer reports a positive sonographic Murphy sign. Common bile duct: Diameter: 2 mm Liver: Liver parenchyma markedly heterogeneous with ill-defined areas of increased echogenicity. 7 cm central liver lesion identified corresponding to the caudate lobe abnormality seen on recent chest CT. Portal vein is patent on color Doppler imaging with normal  direction of blood flow towards the liver. IMPRESSION: 1. Cholelithiasis with borderline gallbladder wall thickening and positive sonographic Murphy sign. Acute cholecystitis a distinct concern. 2. Heterogeneous liver parenchyma with central liver mass corresponding to caudate lobe abnormality seen on recent chest CT. Dedicated abdomen and pelvis CT with oral and intravenous contrast recommended to further evaluate. Electronically Signed   By: Misty Stanley M.D.   On: 09/17/2018 21:16   US Venous Img Lower Bilateral  Result Date: 09/15/2018 CLINICAL DATA:  Bilateral lower extremity edema. History of recent hospitalization. Evaluate for DVT. EXAM: BILATERAL LOWER EXTREMITY VENOUS DOPPLER ULTRASOUND TECHNIQUE: Gray-scale sonography with graded compression, as  well as color Doppler and duplex ultrasound were performed to evaluate the lower extremity deep venous systems from the level of the common femoral vein and including the common femoral, femoral, profunda femoral, popliteal and calf veins including the posterior tibial, peroneal and gastrocnemius veins when visible. The superficial great saphenous vein was also interrogated. Spectral Doppler was utilized to evaluate flow at rest and with distal augmentation maneuvers in the common femoral, femoral and popliteal veins. COMPARISON:  None. FINDINGS: RIGHT LOWER EXTREMITY Common Femoral Vein: No evidence of thrombus. Normal compressibility, respiratory phasicity and response to augmentation. Saphenofemoral Junction: No evidence of thrombus. Normal compressibility and flow on color Doppler imaging. Profunda Femoral Vein: No evidence of thrombus. Normal compressibility and flow on color Doppler imaging. Femoral Vein: No evidence of thrombus. Normal compressibility, respiratory phasicity and response to augmentation. Popliteal Vein: No evidence of thrombus. Normal compressibility, respiratory phasicity and response to augmentation. Calf Veins: No evidence of  thrombus. Normal compressibility and flow on color Doppler imaging. Superficial Great Saphenous Vein: No evidence of thrombus. Normal compressibility. Venous Reflux:  None. Other Findings: There is a large amount of subcutaneous edema the level of the right lower leg and calf. Note is made of a serpiginous approximately 1.6 x 5.1 x 2.7 cm fluid collection within the right popliteal fossa favored to represent a Baker cyst. LEFT LOWER EXTREMITY Common Femoral Vein: No evidence of thrombus. Normal compressibility, respiratory phasicity and response to augmentation. Saphenofemoral Junction: No evidence of thrombus. Normal compressibility and flow on color Doppler imaging. Profunda Femoral Vein: No evidence of thrombus. Normal compressibility and flow on color Doppler imaging. Femoral Vein: No evidence of thrombus. Normal compressibility, respiratory phasicity and response to augmentation. Popliteal Vein: No evidence of thrombus. Normal compressibility, respiratory phasicity and response to augmentation. Calf Veins: No evidence of thrombus. Normal compressibility and flow on color Doppler imaging. Superficial Great Saphenous Vein: No evidence of thrombus. Normal compressibility. Venous Reflux:  None. Other Findings: There is a large amount of subcutaneous edema at the level the left lower leg and calf. IMPRESSION: No evidence of DVT within either lower extremity. Electronically Signed   By: Sandi Mariscal M.D.   On: 09/15/2018 11:44   Dg Chest Port 1 View  Result Date: 09/09/2018 CLINICAL DATA:  Weakness and decreased energy for 6 months. EXAM: PORTABLE CHEST 1 VIEW COMPARISON:  None. FINDINGS: There is cardiomegaly without edema. Small to moderate pleural effusions are present, greater on the right. There is also airspace disease in the right mid and lower lung zones and left lung base. Aortic atherosclerosis is noted. No acute or focal bony abnormality. Heterotopic ossification adjacent to the proximal left humerus is  partially imaged and may be due to old fracture. IMPRESSION: Right greater than left effusions and airspace disease which could be atelectasis or pneumonia. Cardiomegaly without edema. Atherosclerosis. Electronically Signed   By: Inge Rise M.D.   On: 09/09/2018 14:35   US Thoracentesis Asp Pleural Space W/img Guide  Result Date: 09/17/2018 INDICATION: Patient with history of alcohol abuse, cough, dyspnea, left pleural effusion. Request made for diagnostic and therapeutic left thoracentesis. EXAM: ULTRASOUND GUIDED DIAGNOSTIC AND THERAPEUTIC LEFT THORACENTESIS MEDICATIONS: None COMPLICATIONS: None immediate. PROCEDURE: An ultrasound guided thoracentesis was thoroughly discussed with the patient and questions answered. The benefits, risks, alternatives and complications were also discussed. The patient understands and wishes to proceed with the procedure. Written consent was obtained. Ultrasound was performed to localize and mark an adequate pocket of fluid in the left chest. The  area was then prepped and draped in the normal sterile fashion. 1% Lidocaine was used for local anesthesia. Under ultrasound guidance a 6 Fr Safe-T-Centesis catheter was introduced. Thoracentesis was performed. The catheter was removed and a dressing applied. FINDINGS: A total of approximately 710 cc of yellow fluid was removed. Samples were sent to the laboratory as requested by the clinical team. IMPRESSION: Successful ultrasound guided diagnostic and therapeutic left thoracentesis yielding 710 cc of pleural fluid. Read by: Rowe Robert, PA-C Electronically Signed   By: Jacqulynn Cadet M.D.   On: 09/17/2018 15:50    ELIGIBLE FOR AVAILABLE RESEARCH PROTOCOL: no  ASSESSMENT: 62 y.o. [place] man with   (1) severe iron deficiency anemia, s/p transfusion  (a) currently guaiac positive-- GI workup pending  (2) multiple liver lesions c/w neoplasia  (a) s/p left thoracentesis 09/17/2018, cytology pending  (b) CEA and  AFP pending   (3) other problems include fever, severe malnutrition, electrolyte abnormalities, anasarca, pleural and pericardial effusions, s/p multiple fractures  (4) advanced directives -- not in place  PLAN: I called the patient's son to discuss current situation and upcoming tests, but there was no answer.  I anticipate the cytology from thoracentesis 09/17/2018 will not be definitive. I discussed liver biopsy with the patient and wrote that information down for him to share with his family when they visit. I am writing for US biopsy of the liver tomorrow and making him npo overnight.  He is now guaiac positive and he will need GI workup for his iron deficiency anemia (EGD/colonoscopy) at some point. He will also need iron supplementation, which can be feraheme this admission as oral iron supplementation is difficult for many patients and I am not sure what Gregory Rowe compliance will be.  He directs me to his son to help with decision-making and it may be a good idea to try to formalize this if he is willing to name his son as HCPOA. Will consult chaplaincy to assist.  Will follow with you.    Gregory Cruel, MD   09/19/2018 9:16 AM Medical Oncology and Hematology [facility] [address] [place], [place] [address] Tel. [phone]    Fax. [phone]

## 2018-09-19 NOTE — Progress Notes (Signed)
Fecal hemacult returned positive.

## 2018-09-19 NOTE — Progress Notes (Signed)
Pharmacy Antibiotic Note  Gregory Rowe is a 62 y.o. male with multiple liver lesions. Pharmacy has been consulted for Unasyn dosing for PNA/IAI.  Plan: Unasyn 3 g iv q 6h.   - F/U renal function and culture results  Weight: 183 lb 3.2 oz (83.1 kg)  Temp (24hrs), Avg:99.8 F (37.7 C), Min:97.7 F (36.5 C), Max:102.3 F (39.1 C)  Recent Labs  Lab 09/15/18 1203  09/16/18 1211 09/17/18 0510 09/18/18 0428 09/18/18 1627 09/19/18 0457  WBC 6.9  --   --   --  6.8  --   --   CREATININE 0.40   < > 0.41* 0.45* 0.43* 0.43* 0.44*   < > = values in this interval not displayed.    Estimated Creatinine Clearance: 96.8 mL/min (A) (by C-G formula based on SCr of 0.44 mg/dL (L)).    Allergies  Allergen Reactions  . Other Other (See Comments)    NARCOTICS; makes him angry    Antimicrobials this admission: 11/14 ceftriaxone/azithromycin x 1 11/17 Unasyn >>  Dose adjustments this admission:   Microbiology results: 11/15 BCx: NGTD 11/16 UCx:   11/16 Sputum:   11/15 MRSA PCR: negative  Thank you for allowing pharmacy to be a part of this patient's care.  Ulice Dash D 09/19/2018 12:55 PM

## 2018-09-19 NOTE — Progress Notes (Signed)
Temp has decreased to 100.9.   It was reported at beginning of shift that pt has increased confusion (sundowns) at night.  He is more confused, pulling off telemetry wires and oxygen frequently.  Much education and rationale of care and equipment provided and pt verbalizes understanding.  Plus, PRN Ativan given however pt continues to pull of medical equipment frequently.  Have placed pt's hand in mittens and education provided..  Will continue to monitor closely

## 2018-09-20 ENCOUNTER — Inpatient Hospital Stay (HOSPITAL_COMMUNITY): Payer: Medicaid Other

## 2018-09-20 ENCOUNTER — Ambulatory Visit: Payer: Self-pay | Admitting: Medical

## 2018-09-20 DIAGNOSIS — R195 Other fecal abnormalities: Secondary | ICD-10-CM

## 2018-09-20 DIAGNOSIS — R932 Abnormal findings on diagnostic imaging of liver and biliary tract: Secondary | ICD-10-CM

## 2018-09-20 LAB — BASIC METABOLIC PANEL
Anion gap: 6 (ref 5–15)
CALCIUM: 7.3 mg/dL — AB (ref 8.9–10.3)
CO2: 32 mmol/L (ref 22–32)
Chloride: 92 mmol/L — ABNORMAL LOW (ref 98–111)
Creatinine, Ser: 0.42 mg/dL — ABNORMAL LOW (ref 0.61–1.24)
GFR calc Af Amer: >60 mL/min (ref >60)
GLUCOSE: 72 mg/dL (ref 70–99)
Potassium: 3.7 mmol/L (ref 3.5–5.1)
SODIUM: 130 mmol/L — AB (ref 135–145)

## 2018-09-20 LAB — HEPATITIS PANEL, ACUTE
HCV AB: 0.2 {s_co_ratio} (ref 0.0–0.9)
HEP B S AG: NEGATIVE
Hep A IgM: NEGATIVE
Hep B C IgM: NEGATIVE

## 2018-09-20 LAB — URINE CULTURE: CULTURE: NO GROWTH

## 2018-09-20 LAB — AFP TUMOR MARKER: AFP, Serum, Tumor Marker: <0.9 ng/mL (ref 0.0–8.3)

## 2018-09-20 LAB — PROTIME-INR
INR: 1.82
PROTHROMBIN TIME: 20.9 s — AB (ref 11.4–15.2)

## 2018-09-20 LAB — CEA: CEA: 52.7 ng/mL — ABNORMAL HIGH (ref 0.0–4.7)

## 2018-09-20 LAB — T3, FREE: T3 FREE: 1.6 pg/mL — AB (ref 2.0–4.4)

## 2018-09-20 MED ORDER — PHYTONADIONE 5 MG PO TABS
10.0000 mg | ORAL_TABLET | Freq: Once | ORAL | Status: AC
  Administered 2018-09-20: 10 mg via ORAL
  Filled 2018-09-20: qty 2

## 2018-09-20 MED ORDER — LIDOCAINE-EPINEPHRINE (PF) 2 %-1:200000 IJ SOLN
INTRAMUSCULAR | Status: AC | PRN
  Administered 2018-09-20: 10 mL

## 2018-09-20 MED ORDER — GELATIN ABSORBABLE 12-7 MM EX MISC
CUTANEOUS | Status: AC
  Filled 2018-09-20: qty 1

## 2018-09-20 MED ORDER — MIDAZOLAM HCL 2 MG/2ML IJ SOLN
INTRAMUSCULAR | Status: AC | PRN
  Administered 2018-09-20 (×2): 1 mg via INTRAVENOUS

## 2018-09-20 MED ORDER — FENTANYL CITRATE (PF) 100 MCG/2ML IJ SOLN
INTRAMUSCULAR | Status: AC
  Filled 2018-09-20: qty 2

## 2018-09-20 MED ORDER — POTASSIUM CHLORIDE CRYS ER 20 MEQ PO TBCR
40.0000 meq | EXTENDED_RELEASE_TABLET | Freq: Once | ORAL | Status: AC
  Administered 2018-09-20: 40 meq via ORAL
  Filled 2018-09-20: qty 2

## 2018-09-20 MED ORDER — LORAZEPAM 2 MG/ML IJ SOLN
1.0000 mg | Freq: Four times a day (QID) | INTRAMUSCULAR | Status: AC | PRN
  Administered 2018-09-20: 1 mg via INTRAVENOUS
  Filled 2018-09-20: qty 1

## 2018-09-20 MED ORDER — LORAZEPAM 1 MG PO TABS
1.0000 mg | ORAL_TABLET | Freq: Four times a day (QID) | ORAL | Status: AC | PRN
  Filled 2018-09-20: qty 1

## 2018-09-20 MED ORDER — MIDAZOLAM HCL 2 MG/2ML IJ SOLN
INTRAMUSCULAR | Status: AC
  Filled 2018-09-20: qty 2

## 2018-09-20 MED ORDER — FENTANYL CITRATE (PF) 100 MCG/2ML IJ SOLN
INTRAMUSCULAR | Status: AC | PRN
  Administered 2018-09-20 (×2): 50 ug via INTRAVENOUS

## 2018-09-20 MED ORDER — LIDOCAINE-EPINEPHRINE (PF) 2 %-1:200000 IJ SOLN
INTRAMUSCULAR | Status: AC
  Filled 2018-09-20: qty 20

## 2018-09-20 NOTE — Consult Note (Addendum)
Referring Provider: [facility]  Primary Care Physician:  Mackie Pai, PA-C (established care on 09/15/2018) Primary Gastroenterologist: unassigned    Reason for Consultation:   Blood in stool, anemia    ASSESSMENT    1. 62 yo male profound IDA, hgb stable in mid 9 range after 5 units of blood during [facility] admission a few days ago. No overt GI bleeding during that admission however stool is positive for occult blood.   2. ETOH abuse, last beer reportedly a week or so ago. No evidence for cirrhosis by exam. Albumin low, he is coagulopathic but this could be nutritional.     PLAN:   -No colon, gastric lesions seen on CT scan but need to consider GI tract neoplasm with his anemia and liver lesions on imaging. He has just returned from liver biopsy, still drowsy and difficult to get good history from.  -Ideally he needs EGD and colonoscopy for further evaluation of severe IDA. Patient declines these but I'm not sure that he is capable of making his own medical decisions, at least not right now. I will talk to his son, Gregory Rowe.  -Oncology is following.  -Dose of vitamin K now.    ADDENDUM:  Called and spoke with son Gregory Rowe. Son gives consent for most of patient's medical procedures and is agreeable to an EGD / colonoscopy. He will talk further to patient this evening or in am. Plan would be to prep tomorrow and do procedures on Wed as long as patient is medically stable.      Attending physician's note   I have taken a history, examined the patient and reviewed the chart. I agree with the Advanced Practitioner's note, impression and recommendations.   Severe iron deficiency anemia with occult blood in stool and multiple hepatic lesions on CT. No overt bleeding noted. R/O GI tract malignancy with hepatic mets or other source of chronic GI blood loss. No gastric, small bowel or colon lesions noted on CT.  Agree with IV Fe replacement. Trend CBC. Recommend colonoscopy and EGD. Pt is  not agreeable to proceed at this time however his son is agreeable. Not sure if the patient can cooperate to adequately complete a bowel prep.  Hepatic lesions, awaiting pathology. Coagulopathy, etiology unclear. Vit K. Hyponatremia.  ETOH abuse.    Gregory Edward, MD [facility] [phone]     HPI: Gregory Rowe is a 62 y.o. male with hx of ETOH abuse admitted to [facility] a week ago with profound iron deficiency anemia and hyponatremia felt to be secondary to beer potomania. Hgb was 3, he received 5 units of blood and hgb settled around 8. No overt GI bleeding and he was heme negative so discharged home with recommendations for outpatient endoscopic evaluation. Patient saw his PCP on 11/13 with edema and shortness of breath.  He was brought back to ED by his son later that day after PCP called them with lab results including a Na+ of 122.  Also CXR suspicious was suspicious for left sided PNA with pleural effusion.   Hard to get history from patient. Just had fentanyl and versed but also nurse says he has some confusion at baseline.   Data reviewed. ED labs  Na+ 122, Mg+ 1.6, K+ 2.8  Hgb stable at 9.8  Interim labs:  INR 1.96 on 11/16 >>>> 1.82 today Na + 130, Mg+ 2.1 Hgb stable at 9.7 FOBT + Tbili mildly elevated around 2, alk phos normal. Transaminases are normal.   Chest CT scan  >>  small right pleural effusion, small to mod left pleural effusion, ? PNA. Unsual enlargement of caudate lobe of the liver  To 7.3 cm, cholelithiasis.  CTAP with contrast -- multiple liver masses, multifocal HCC vrs mets.   Patient underwent L thoracentesis on 09/17/18, cytology is pending.   Scan found abnormalities of caudate lobe of liver. CTAP done and shows multiple liver lesion. No masses seen in colon. CEA and AFP are pending.    Past Medical History:  Diagnosis Date  . Alcoholism ([place])   . Anemia     History reviewed. No pertinent surgical history.  Prior to Admission medications   Medication  Sig Start Date End Date Taking? Authorizing Provider  Carboxymethylcellul-Glycerin (CLEAR EYES FOR DRY EYES) 1-0.25 % SOLN Apply 1-2 drops to eye 2 (two) times daily.   Yes [provider]  nystatin cream (MYCOSTATIN) Apply 1 application topically 2 (two) times daily. 09/15/18  Yes Saguier, Percell Miller, PA-C    Current Facility-Administered Medications  Medication Dose Route Frequency Provider Last Rate Last Dose  . 0.9 %  sodium chloride infusion   Intravenous PRN Cardama, Grayce Sessions, MD   Stopped at 09/20/18 [phone]  . acetaminophen (TYLENOL) tablet 650 mg  650 mg Oral Q6H PRN Annita Brod, MD   650 mg at 09/20/18 1057   Or  . acetaminophen (TYLENOL) suppository 650 mg  650 mg Rectal Q6H PRN Annita Brod, MD      . Ampicillin-Sulbactam (UNASYN) 3 g in sodium chloride 0.9 % 100 mL IVPB  3 g Intravenous Q6H Florencia Reasons, MD 200 mL/hr at 09/20/18 1057 3 g at 09/20/18 1057  . benzonatate (TESSALON) capsule 100 mg  100 mg Oral TID PRN Florencia Reasons, MD   100 mg at 09/17/18 1741  . cholecalciferol (VITAMIN D) tablet 1,000 Units  1,000 Units Oral Daily Florencia Reasons, MD   1,000 Units at 09/20/18 1057  . feeding supplement (ENSURE ENLIVE) (ENSURE ENLIVE) liquid 237 mL  237 mL Oral BID BM Annita Brod, MD   237 mL at 09/19/18 1954  . folic acid (FOLVITE) tablet 1 mg  1 mg Oral Daily Gevena Barre K, MD   1 mg at 09/20/18 1057  . furosemide (LASIX) injection 20 mg  20 mg Intravenous Q12H Annita Brod, MD   20 mg at 09/20/18 0938  . multivitamin with minerals tablet 1 tablet  1 tablet Oral Daily Annita Brod, MD   1 tablet at 09/20/18 1057  . ondansetron (ZOFRAN) tablet 4 mg  4 mg Oral Q6H PRN Annita Brod, MD   4 mg at 09/18/18 1025   Or  . ondansetron (ZOFRAN) injection 4 mg  4 mg Intravenous Q6H PRN Annita Brod, MD      . spironolactone (ALDACTONE) tablet 100 mg  100 mg Oral Daily Florencia Reasons, MD   100 mg at 09/20/18 1057  . thiamine (VITAMIN B-1) tablet 100 mg  100  mg Oral Daily Gevena Barre K, MD   100 mg at 09/20/18 1057   Or  . thiamine (B-1) injection 100 mg  100 mg Intravenous Daily Annita Brod, MD      . vitamin B-12 (CYANOCOBALAMIN) tablet 1,000 mcg  1,000 mcg Oral Daily Florencia Reasons, MD   1,000 mcg at 09/20/18 1057    Allergies as of 09/15/2018 - Review Complete 09/15/2018  Allergen Reaction Noted  . Other Other (See Comments) 09/09/2018    History reviewed. No pertinent family history.  Social History  Socioeconomic History  . Marital status: Single    Spouse name: Not on file  . Number of children: Not on file  . Years of education: Not on file  . Highest education level: Not on file  Occupational History  . Not on file  Social Needs  . Financial resource strain: Not hard at all  . Food insecurity:    Worry: Never true    Inability: Never true  . Transportation needs:    Medical: Patient refused    Non-medical: Patient refused  Tobacco Use  . Smoking status: Never Smoker  . Smokeless tobacco: Current User    Types: Snuff  Substance and Sexual Activity  . Alcohol use: Yes    Alcohol/week: 4.0 - 5.0 standard drinks    Types: 4 - 5 Cans of beer per week  . Drug use: Never  . Sexual activity: Not Currently  Lifestyle  . Physical activity:    Days per week: Patient refused    Minutes per session: Patient refused  . Stress: Only a little  Relationships  . Social connections:    Talks on phone: Patient refused    Gets together: Patient refused    Attends religious service: Patient refused    Active member of club or organization: Patient refused    Attends meetings of clubs or organizations: Patient refused    Relationship status: Patient refused  . Intimate partner violence:    Fear of current or ex partner: No    Emotionally abused: No    Physically abused: No    Forced sexual activity: No  Other Topics Concern  . Not on file  Social History Narrative   Patient lives with significant other.    Review  of Systems: Unobtainable. Patient has some confusion at present   Physical Exam: Vital signs in last 24 hours: Temp:  [98.5 F (36.9 C)-100.5 F (38.1 C)] 99.1 F (37.3 C) (11/18 0622) Pulse Rate:  [86-102] 102 (11/18 0622) Resp:  [16-20] 16 (11/18 0622) BP: (105-113)/(49-66) 105/59 (11/18 0622) SpO2:  [89 %-100 %] 97 % (11/18 0622) Last BM Date: 09/19/18 General:   Slightly drowsy, well developed male in NAD Psych:  Not uncooperative but answers not always in appropriate context Eyes:  Pupils equal, sclera clear, no icterus.   Conjunctiva pink. Ears:  Normal auditory acuity. Nose:  No deformity, discharge,  or lesions. Neck:  Supple; no masses Lungs:  Clear throughout to auscultation.    Heart:  Regular rate and rhythm; no murmurs, no lower extremity edema Abdomen:  Soft, non-distended, nontender, BS active Rectal:  Deferred  Msk:  Symmetrical without gross deformities. . Neurologic:  Alert and oriented to person and place. Skin:  Intact without significant lesions or rashes.   Intake/Output from previous day: 11/17 0701 - 11/18 0700 In: 919.2 [P.O.:590; I.V.:3.6; IV Piggyback:325.6] Out: 4450 [Urine:4450] Intake/Output this shift: No intake/output data recorded.  Lab Results: Recent Labs    09/18/18 0428  WBC 6.8  HGB 9.7*  HCT 32.7*  PLT 210   BMET Recent Labs    09/18/18 1627 09/19/18 0457 09/20/18 0503  NA 126* 128* 130*  K 3.7 3.9 3.7  CL 93* 94* 92*  CO2 26 30 32  GLUCOSE 75 85 72  BUN <5* <5* <5*  CREATININE 0.43* 0.44* 0.42*  CALCIUM 7.1* 7.3* 7.3*   LFT Recent Labs    09/18/18 0428  PROT 5.6*  ALBUMIN 1.8*  AST 26  ALT 12  ALKPHOS 92  BILITOT  2.1*   PT/INR Recent Labs    09/18/18 0428 09/20/18 0503  LABPROT 22.1* 20.9*  INR 1.96 1.82   Hepatitis Panel No results for input(s): HEPBSAG, HCVAB, HEPAIGM, HEPBIGM in the last 72 hours.    Studies/Results: Ct Abdomen Pelvis W Contrast  Result Date: 09/18/2018 CLINICAL DATA:   Right upper quadrant abdominal pain. Clinical concern for cholecystitis. Alcohol abuse. EXAM: CT ABDOMEN AND PELVIS WITH CONTRAST TECHNIQUE: Multidetector CT imaging of the abdomen and pelvis was performed using the standard protocol following bolus administration of intravenous contrast. CONTRAST:  131m OMNIPAQUE IOHEXOL 300 MG/ML  SOLN COMPARISON:  Limited right upper quadrant abdomen ultrasound obtained yesterday. Chest CT dated 09/16/2018. FINDINGS: Lower chest: No significant change in a pericardial effusion measuring 1.7 cm in maximum thickness. The heart remains enlarged. Moderate-sized left and small to moderate-sized right pleural effusions are again demonstrated with partial loculation on right. Associated bilateral lower lobe compressive atelectasis is also again demonstrated. Hepatobiliary: On today's contrast examination, multiple liver masses are demonstrated. The previously demonstrated caudate lobe mass measures 8.8 x 7.1 cm on image number 23 series 3. A left lobe mass measures 7.9 x 5.8 cm on image number 19 series 3 with 2 adjacent smaller masses. A right lobe mass measures 8.5 x 5.0 cm on image number 30 series 3 with an extension toward the porta hepatis. A 9 mm gallstone is demonstrated in the gallbladder as well as smaller adjacent gallstones. No gallbladder wall thickening or pericholecystic fluid is seen. Pancreas: Unremarkable. No pancreatic ductal dilatation or surrounding inflammatory changes. Spleen: Normal in size without focal abnormality. Adrenals/Urinary Tract: Normal appearing adrenal glands, kidneys and ureters. Foley catheter in the urinary bladder with a small amount of associated air in the bladder and no significant urine. Stomach/Bowel: Large number of sigmoid colon diverticula without evidence of diverticulitis. No evidence of appendicitis. Unremarkable stomach and small bowel. Vascular/Lymphatic: Atheromatous arterial calcifications. Enlarged aortocaval lymph node with a  short axis diameter of 16 mm on image number 35 series 3. Additional smaller enlarged aortocaval lymph nodes. There are also mildly enlarged retrocaval nodes, the largest with a short axis diameter 9 mm on image number 39 series 3. Mildly enlarged gastrohepatic ligament node with a short axis diameter 8 mm on image number 25 series 3. Reproductive: Prostate is unremarkable. Other: Diffuse subcutaneous edema. Minimal peritoneal fluid. Small umbilical hernia containing fat. Musculoskeletal: Proximal right femur fixation hardware. Lumbar and lower thoracic spine degenerative changes and mild scoliosis. Approximately 40% L3 vertebral compression deformity with Schmorl's node formation inferiorly with no acute fracture lines and no significant bony retropulsion. Stable approximately 15% old T12 superior endplate compression deformity. IMPRESSION: 1. Multiple liver masses. Most of these are large. Differential considerations include multifocal hepatocellular carcinoma and metastases. 2. Mild retroperitoneal and gastrohepatic ligament adenopathy suspicious for metastatic adenopathy. 3. Stable moderate-sized left and small to moderate-sized right pleural effusions with partial loculation on the right. 4. Stable moderate-sized pericardial effusion. 5. Cholelithiasis without evidence of acute cholecystitis. 6. Diffuse subcutaneous edema and minimal free peritoneal fluid. 7. Extensive sigmoid diverticulosis. Electronically Signed   By: SClaudie ReveringM.D.   On: 09/18/2018 16:54     PTye Savoy NP-C @  09/20/2018, 12:37 PM

## 2018-09-20 NOTE — Clinical Social Work Note (Signed)
Clinical Social Work Assessment  Patient Details  Name: Gregory Rowe MRN: [id number] Date of Birth: 1956-04-13  Date of referral:  09/20/18               Reason for consult:  Discharge Planning, Facility Placement                Permission sought to share information with:  Family Supports Permission granted to share information::  Yes, Verbal Permission Granted  Name::     Gregory Rowe   Agency::  snf  Relationship::  son  Contact Information:  [phone]  Housing/Transportation Living arrangements for the past 2 months:  [place] of Information:  Adult Children Patient Interpreter Needed:  None Criminal Activity/Legal Involvement Pertinent to Current Situation/Hospitalization:    Significant Relationships:  Adult Children, Significant Other Lives with:  Significant Other Do you feel safe going back to the place where you live?  Yes Need for family participation in patient care:  Yes (Comment)  Care giving concerns:  Patient lives at home with significant other in [facility]. Patient has support from adult son Gregory Rowe. Per Gregory Rowe patient has always worked for himself and does not have money to pay for rehab   Social Worker assessment / plan:  CSW spoke with patients son via phone. Gregory Rowe stated he does not want patient to go back home because patients girlfriend will not be able to handle patients care at home. CSW asked if patient or family will be able to pay out of pocket for SNF stay. Son stated he will try to pay for it but stated he does work for himself so money is tight. Son stated he would like patient to go to a rehab facility in the [place] area.  Employment status:  Retired Forensic scientist:  Self Pay (Medicaid Pending) PT Recommendations:  [place] / Referral to community resources:  [place]  Patient/Family's Response to care:  Son supportive of patient and his needs  Patient/Family's  Understanding of and Emotional Response to Diagnosis, Current Treatment, and Prognosis:  Family agreeable for patient to go to rehab but is aware due to the lack of insurance it will be hard to find a facility willing to take patient   Emotional Assessment Appearance:  Appears stated age Attitude/Demeanor/Rapport:  Unable to Assess Affect (typically observed):  Unable to Assess Orientation:  Oriented to Self Alcohol / Substance use:  Not Applicable Psych involvement (Current and /or in the community):  No (Comment)  Discharge Needs  Concerns to be addressed:  Care Coordination Readmission within the last 30 days:  No Current discharge risk:    Barriers to Discharge:  Continued Medical Work up, Inadequate or no insurance, No SNF bed   Wende Neighbors, LCSW 09/20/2018, 5:12 PM
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
## 2018-09-20 NOTE — Progress Notes (Signed)
Received pt from IR, Report received from [place], [place]. Pt lethergic and aroused to name. Pt VS stable and biospy site intact. Denies pain, only when moving in bed. SRP, RN

## 2018-09-20 NOTE — Procedures (Signed)
Pre Procedure Dx: Liver Lesion Post Procedural Dx: Same  Technically successful US guided biopsy of indeterminate mass within the left lobe of the liver.   EBL: None  No immediate complications.   Ronny Bacon, MD Pager #: [phone]

## 2018-09-20 NOTE — Progress Notes (Addendum)
Pt had total two loose "mucoid" blood tinged stools. MD made aware during morning rounds. SRP, RN

## 2018-09-20 NOTE — Progress Notes (Signed)
Patient ID: Gregory Rowe, male   DOB: September 07, 1956, 62 y.o.   MRN: [id number] Patient tentatively scheduled for image guided liver lesion biopsy today.  Details/risks of procedure, including but not limited to, internal bleeding, infection, injury to adjacent structures discussed with patient's son (pt with some confusion) with his understanding and consent.

## 2018-09-20 NOTE — Progress Notes (Signed)
Pt confused on CIWA, pulling foley cath, pulled out IV, yelling, stating he wants to leave and get out of the hospital. Called pt son to comfort patient. Pt attempted to move to side of bed. Pt inct and placed hand  BM, stating you can't stop me. Placed pt to bed and updated CN, tele sitter implemented. Pt son plans to come and stay with patient later, MD made aware and CIWA protocol restarted. SRP, RN

## 2018-09-20 NOTE — Progress Notes (Signed)
   09/20/18 1353  Clinical Encounter Type  Visited With Patient  Visit Type Initial  Referral From Physician  Consult/Referral To Chaplain (AD)  The chaplain responded to Pt. spiritual care request for AD.  The chaplain entered the Pt. room when the Pt. was returning from a procedure.  The Pt. at the time of visit presented himself to the chaplain without clear communication.  The RN stated the Pt. son may have questions about the AD. The chaplain understands the Pt. son has already left for the day. The chaplain will make plans to follow up.

## 2018-09-20 NOTE — Progress Notes (Signed)
Telesitter at bedside, pt calmer at current time. Able to replace IV and oxygen. SRP, RN
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
## 2018-09-21 DIAGNOSIS — R160 Hepatomegaly, not elsewhere classified: Secondary | ICD-10-CM

## 2018-09-21 LAB — CBC WITH DIFFERENTIAL/PLATELET
ABS IMMATURE GRANULOCYTES: 0.04 10*3/uL (ref 0.00–0.07)
BASOS ABS: 0.1 10*3/uL (ref 0.0–0.1)
Basophils Relative: 1 %
Eosinophils Absolute: 0.2 10*3/uL (ref 0.0–0.5)
Eosinophils Relative: 3 %
HCT: 32.1 % — ABNORMAL LOW (ref 39.0–52.0)
Hemoglobin: 9.6 g/dL — ABNORMAL LOW (ref 13.0–17.0)
IMMATURE GRANULOCYTES: 1 %
Lymphocytes Relative: 20 %
Lymphs Abs: 1.5 10*3/uL (ref 0.7–4.0)
MCH: 24.5 pg — ABNORMAL LOW (ref 26.0–34.0)
MCHC: 29.9 g/dL — ABNORMAL LOW (ref 30.0–36.0)
MCV: 81.9 fL (ref 80.0–100.0)
Monocytes Absolute: 1.1 10*3/uL — ABNORMAL HIGH (ref 0.1–1.0)
Monocytes Relative: 15 %
NEUTROS ABS: 4.5 10*3/uL (ref 1.7–7.7)
NEUTROS PCT: 60 %
NRBC: 0 % (ref 0.0–0.2)
PLATELETS: 222 10*3/uL (ref 150–400)
RBC: 3.92 MIL/uL — AB (ref 4.22–5.81)
RDW: 28.6 % — ABNORMAL HIGH (ref 11.5–15.5)
WBC: 7.3 10*3/uL (ref 4.0–10.5)

## 2018-09-21 LAB — GLUCOSE, CAPILLARY
GLUCOSE-CAPILLARY: 118 mg/dL — AB (ref 70–99)
Glucose-Capillary: 33 mg/dL — CL (ref 70–99)

## 2018-09-21 LAB — BODY FLUID CULTURE: CULTURE: NO GROWTH

## 2018-09-21 LAB — BASIC METABOLIC PANEL
Anion gap: 10 (ref 5–15)
BUN: <5 mg/dL — ABNORMAL LOW (ref 8–23)
CHLORIDE: 93 mmol/L — AB (ref 98–111)
CO2: 29 mmol/L (ref 22–32)
Calcium: 7.3 mg/dL — ABNORMAL LOW (ref 8.9–10.3)
Creatinine, Ser: 0.43 mg/dL — ABNORMAL LOW (ref 0.61–1.24)
GFR calc non Af Amer: >60 mL/min (ref >60)
Glucose, Bld: 49 mg/dL — ABNORMAL LOW (ref 70–99)
POTASSIUM: 3.6 mmol/L (ref 3.5–5.1)
SODIUM: 132 mmol/L — AB (ref 135–145)

## 2018-09-21 LAB — CULTURE, RESPIRATORY W GRAM STAIN

## 2018-09-21 LAB — MAGNESIUM: Magnesium: 1.4 mg/dL — ABNORMAL LOW (ref 1.7–2.4)

## 2018-09-21 LAB — CULTURE, RESPIRATORY

## 2018-09-21 MED ORDER — MAGNESIUM SULFATE 4 GM/100ML IV SOLN
4.0000 g | Freq: Once | INTRAVENOUS | Status: AC
  Administered 2018-09-21: 4 g via INTRAVENOUS
  Filled 2018-09-21: qty 100

## 2018-09-21 MED ORDER — POTASSIUM CHLORIDE CRYS ER 20 MEQ PO TBCR
40.0000 meq | EXTENDED_RELEASE_TABLET | Freq: Once | ORAL | Status: DC
  Filled 2018-09-21: qty 2

## 2018-09-21 MED ORDER — DEXTROSE 50 % IV SOLN
1.0000 | Freq: Once | INTRAVENOUS | Status: AC
  Administered 2018-09-21: 50 mL via INTRAVENOUS

## 2018-09-21 MED ORDER — PHYTONADIONE 5 MG PO TABS
10.0000 mg | ORAL_TABLET | Freq: Every day | ORAL | Status: AC
  Administered 2018-09-22: 10 mg via ORAL
  Filled 2018-09-21 (×2): qty 2

## 2018-09-21 MED ORDER — DEXTROSE 50 % IV SOLN
INTRAVENOUS | Status: AC
  Administered 2018-09-21: 50 mL via INTRAVENOUS
  Filled 2018-09-21: qty 50

## 2018-09-21 NOTE — Progress Notes (Signed)
hypoglycemiaHypoglycemic Event  CBG: 33  Treatment: D50 IV 50 mL  Symptoms: None  Follow-up CBG: Time:0701 CBG Result:118  Possible Reasons for Event: Inadequate meal intake  Comments/MD notified:K.Schorr on call provider    Gonzella Lex
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
## 2018-09-21 NOTE — Progress Notes (Signed)
Pt temp 101.2 at 1306. Tylenol given at 1323. RN attempted to retake temperature at 1423. Pt refused and threatened to "slap you if you come any closer." Pt educated regarding importance of monitoring abnormal temperature. Will attempt to follow up with temperature later. Xylan Sheils, Bing Neighbors, RN

## 2018-09-21 NOTE — Progress Notes (Addendum)
[facility] Progress Note   Chief Complaint:   Anemia, blood in stool, liver lesions    SUBJECTIVE:    sleepy, no specific complaints.   ASSESSMENT AND PLAN:   1. 62 yo male with profound IDA, hgb stable at 9.6 after 5 units of blood during [facility] admission a few days ago. No overt GI bleeding during that admission however he did have blood in stool yesterday. Now with findings of multiple liver lesions on imaging. Rule out metastatic disease.  Liver bx yesterday, results pending. AFP normal. CEA elevated at 61 -I spoke with son [name] yesterday who was agreeable to endoscopic evaluation but I don't see how patient could prep for the procedure. He was combative last evening. Today he is drowsy, difficult to keep awake this am (did get ativan last evening however).    2. ETOH abuse.  No evidence for cirrhosis by exam. Albumin low and he is coagulopathic but these could be nutritional.   -INR down from 1.96 to 1.82 this am after dose of Vit K yesterday  3. Fevers this admission, blood Cx, urine cx, workup negative thus far. On Unasyn to cover possible aspiration PNA.      Attending Physician's note   I have taken an interval history, reviewed the chart and examined the patient. I agree with the Advanced Practitioner's note, impression and recommendations.  Awaiting liver lesion pathology. CEA elevated at 52.7. AFP is normal at < 0.9. 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 Intermittently drowsy today and was combative last evening. The patient declines colonoscopy and EGD. Although his son was agreeable the patient does not appear willing or able to proceed with colonoscopy and EGD and related bowel prep.  No evidence of cirrhosis.   INR improved post Vit K. Could be nutrition related. Repeat Vit K today and tomorrow.  Possible aspiration PNA on Unasyn.  Lucio Edward, MD FACG [phone]      OBJECTIVE:     Vital signs in last 24 hours: Temp:  [97.9 F (36.6  C)-99.7 F (37.6 C)] 99.7 F (37.6 C) (11/19 0732) Pulse Rate:  [86-113] 113 (11/19 0732) Resp:  [10-20] 20 (11/19 0732) BP: (99-127)/(53-78) 104/54 (11/19 0732) SpO2:  [89 %-100 %] 100 % (11/19 0732) Weight:  [73.6 kg] 73.6 kg (11/19 0500) Last BM Date: 09/20/18 General:    well-developed male in NAD EENT:  Normal hearing, non icteric sclera, conjunctive pink.  Heart:  Regular rate and rhythm , !+ BLE edema   Pulm: Normal respiratory effort Abdomen:  Soft, nondistended, nontender.  Normal bowel sounds, no masses felt.       Neurologic:  Drowsy, difficult to assess for any asterixis due to his inability to participate. Psych:  cooperative.  .   Intake/Output from previous day: 11/18 0701 - 11/19 0700 In: 756.5 [P.O.:550; I.V.:18.1; IV Piggyback:188.4] Out: 1500 [Urine:1500] Intake/Output this shift: No intake/output data recorded.  Lab Results: Recent Labs    09/21/18 0522  WBC 7.3  HGB 9.6*  HCT 32.1*  PLT 222   BMET Recent Labs    09/19/18 0457 09/20/18 0503 09/21/18 0522  NA 128* 130* 132*  K 3.9 3.7 3.6  CL 94* 92* 93*  CO2 30 32 29  GLUCOSE 85 72 49*  BUN <5* <5* <5*  CREATININE 0.44* 0.42* 0.43*  CALCIUM 7.3* 7.3* 7.3*     PT/INR Recent Labs    09/20/18 0503  LABPROT 20.9*  INR 1.82   Hepatitis Panel Recent Labs  09/18/18 1718  HEPBSAG Negative  HCVAB 0.2  HEPAIGM Negative  HEPBIGM Negative    US Biopsy (liver)  Result Date: 09/20/2018 INDICATION: No known primary, now with multiple liver lesions/masses worrisome for metastatic disease versus multifocal hepatocellular carcinoma (patient with history of alcohol abuse). Please perform ultrasound-guided liver lesion biopsy for tissue diagnostic purposes. EXAM: ULTRASOUND GUIDED LIVER LESION BIOPSY COMPARISON:  CT abdomen pelvis - 09/18/2018; chest CT - 09/16/2028 MEDICATIONS: None ANESTHESIA/SEDATION: Fentanyl 100 mcg IV; Versed 2 mg IV Total Moderate Sedation time:  13 Minutes. The  patient's level of consciousness and vital signs were monitored continuously by radiology nursing throughout the procedure under my direct supervision. COMPLICATIONS: None immediate. PROCEDURE: Informed written consent was obtained from the patient after a discussion of the risks, benefits and alternatives to treatment. The patient understands and consents the procedure. A timeout was performed prior to the initiation of the procedure. Ultrasound scanning was performed of the right upper abdominal quadrant demonstrates multiple mixed echogenic lesions and masses scattered within the liver compatible the findings seen on preceding abdominal CT. Dominant ill-defined mixed echogenic mass within left lobe of the liver measuring at least 8.2 x 6.3 cm (image 23), was targeted for biopsy given lesion location and sonographic window. The procedure was planned. The midline of the abdomen was prepped and draped in the usual sterile fashion. The overlying soft tissues were anesthetized with 1% lidocaine with epinephrine. A 17 gauge, 6.8 cm co-axial needle was advanced into a peripheral aspect of the lesion. This was followed by 5 core biopsies with an 18 gauge core device under direct ultrasound guidance. Multiple ultrasound images were saved for procedural documentation purposes. The coaxial needle tract was embolized with a small amount of Gel-Foam slurry and superficial hemostasis was obtained with manual compression. Post procedural scanning was negative for definitive area of hemorrhage or additional complication. A dressing was placed. The patient tolerated the procedure well without immediate post procedural complication. IMPRESSION: Technically successful ultrasound guided core needle biopsy of infiltrative mass with left lobe of the liver. Electronically Signed   By: Sandi Mariscal M.D.   On: 09/20/2018 16:16    Active Problems:   Loculated pleural effusion   Hyponatremia   Hypokalemia   Protein calorie  malnutrition ([place])   Anasarca    LOS: 5 days   Tye Savoy ,NP 09/21/2018, 9:27 AM

## 2018-09-21 NOTE — Progress Notes (Signed)
PT Cancellation Note  Patient Details Name: Gregory Rowe MRN: [id number] DOB: 10-Nov-1955   Cancelled Treatment:    Reason Eval/Treat Not Completed: Patient declined, no reason specified--pt adamantly refused to participate despite encouragement    Weston Anna, PT Acute Rehabilitation Services Pager: [phone] Office: [phone]

## 2018-09-21 NOTE — Progress Notes (Signed)
Pt removed tele and refusing to let RN replace it. Attempting to get pt's wife on the phone to help reorient him. Dreon Pineda, Bing Neighbors, RN

## 2018-09-21 NOTE — Progress Notes (Signed)
When giving pt meds this am, pt spit into pill cup.  This RN offered to bring a new cup with fresh pills and pt declined. Refused all oral meds this am. Markesia Crilly, Bing Neighbors, RN

## 2018-09-22 DIAGNOSIS — Z9889 Other specified postprocedural states: Secondary | ICD-10-CM

## 2018-09-22 LAB — CBC WITH DIFFERENTIAL/PLATELET
ABS IMMATURE GRANULOCYTES: 0.03 10*3/uL (ref 0.00–0.07)
BASOS PCT: 1 %
Basophils Absolute: 0.1 10*3/uL (ref 0.0–0.1)
Eosinophils Absolute: 0.2 10*3/uL (ref 0.0–0.5)
Eosinophils Relative: 3 %
HCT: 30.1 % — ABNORMAL LOW (ref 39.0–52.0)
HEMOGLOBIN: 9 g/dL — AB (ref 13.0–17.0)
IMMATURE GRANULOCYTES: 1 %
LYMPHS ABS: 1.5 10*3/uL (ref 0.7–4.0)
LYMPHS PCT: 25 %
MCH: 25 pg — ABNORMAL LOW (ref 26.0–34.0)
MCHC: 29.9 g/dL — AB (ref 30.0–36.0)
MCV: 83.6 fL (ref 80.0–100.0)
MONOS PCT: 15 %
Monocytes Absolute: 0.9 10*3/uL (ref 0.1–1.0)
NEUTROS ABS: 3.3 10*3/uL (ref 1.7–7.7)
NEUTROS PCT: 55 %
Platelets: 200 10*3/uL (ref 150–400)
RBC: 3.6 MIL/uL — ABNORMAL LOW (ref 4.22–5.81)
RDW: 28.6 % — ABNORMAL HIGH (ref 11.5–15.5)
WBC: 5.9 10*3/uL (ref 4.0–10.5)
nRBC: 0 % (ref 0.0–0.2)

## 2018-09-22 LAB — BASIC METABOLIC PANEL
ANION GAP: 4 — AB (ref 5–15)
BUN: <5 mg/dL — ABNORMAL LOW (ref 8–23)
CHLORIDE: 92 mmol/L — AB (ref 98–111)
CO2: 32 mmol/L (ref 22–32)
Calcium: 7.2 mg/dL — ABNORMAL LOW (ref 8.9–10.3)
Creatinine, Ser: 0.38 mg/dL — ABNORMAL LOW (ref 0.61–1.24)
GFR calc Af Amer: >60 mL/min (ref >60)
GLUCOSE: 99 mg/dL (ref 70–99)
POTASSIUM: 3.4 mmol/L — AB (ref 3.5–5.1)
Sodium: 128 mmol/L — ABNORMAL LOW (ref 135–145)

## 2018-09-22 LAB — VITAMIN B1: VITAMIN B1 (THIAMINE): 16 nmol/L (ref 8–30)

## 2018-09-22 LAB — PROTIME-INR
INR: 1.6
Prothrombin Time: 18.8 seconds — ABNORMAL HIGH (ref 11.4–15.2)

## 2018-09-22 LAB — MAGNESIUM: MAGNESIUM: 1.8 mg/dL (ref 1.7–2.4)

## 2018-09-22 LAB — AMMONIA: AMMONIA: 31 umol/L (ref 9–35)

## 2018-09-22 NOTE — Progress Notes (Signed)
   09/22/18 1450  Clinical Encounter Type  Visited With Patient  Visit Type Follow-up;Other (Comment) (AD)  Referral From Chaplain  Consult/Referral To Chaplain  Chaplain followed up on Monday's spiritual care consult for Pt. AD. Pt. determined through chaplain education the Pt. son will be HCPOA through [facility] law without documentation.   The chaplain left her name and how to contact a chaplain through the RN if questions arise about an AD with Pt. family.

## 2018-09-22 NOTE — Progress Notes (Signed)
Cell Phone at charging station at the desk. SRP, RN

## 2018-09-22 NOTE — NC FL2 (Signed)
[place] LEVEL OF CARE SCREENING TOOL     IDENTIFICATION  Patient Name: Gregory Rowe Birthdate: 09-02-1956 Sex: male Admission Date (Current Location): 09/15/2018  [facility] and [place] Number:  Herbalist and Address:  [facility],  [place] [address] [place]      Provider Number: [id number]  Attending Physician Name and Address:  Bonnell Public, MD  Relative Name and Phone Number:       Current Level of Care: Hospital Recommended Level of Care: [place] Prior Approval Number:    Date Approved/Denied:   PASRR Number:   [id number]   Discharge Plan: SNF    Current Diagnoses: Patient Active Problem List   Diagnosis Date Noted  . Loculated pleural effusion 09/16/2018  . Hyponatremia 09/16/2018  . Hypokalemia 09/16/2018  . Protein calorie malnutrition ([place]) 09/16/2018  . Anasarca 09/16/2018  . Symptomatic anemia 09/12/2018    Orientation RESPIRATION BLADDER Height & Weight     Self, Place  O2(2L) Incontinent Weight: 160 lb 11.2 oz (72.9 kg) Height:  5\' 6"  (167.6 cm)  BEHAVIORAL SYMPTOMS/MOOD NEUROLOGICAL BOWEL NUTRITION STATUS      Incontinent Diet(regular)  AMBULATORY STATUS COMMUNICATION OF NEEDS Skin   Extensive Assist Verbally Normal                       Personal Care Assistance Level of Assistance  Bathing, Feeding, Dressing Bathing Assistance: Limited assistance Feeding assistance: Independent Dressing Assistance: Limited assistance     Functional Limitations Info             SPECIAL CARE FACTORS FREQUENCY  PT (By licensed PT), OT (By licensed OT)     PT Frequency: 5 OT Frequency: 5            Contractures      Additional Factors Info  Code Status, Allergies Code Status Info: Full code  Allergies Info: NARCOTICS( makes him angry)           Current Medications (09/22/2018):  This is the current hospital active medication list Current  Facility-Administered Medications  Medication Dose Route Frequency Provider Last Rate Last Dose  . 0.9 %  sodium chloride infusion   Intravenous PRN Cardama, Grayce Sessions, MD   Stopped at 09/20/18 1140  . acetaminophen (TYLENOL) tablet 650 mg  650 mg Oral Q6H PRN Annita Brod, MD   650 mg at 09/21/18 1323   Or  . acetaminophen (TYLENOL) suppository 650 mg  650 mg Rectal Q6H PRN Annita Brod, MD      . Ampicillin-Sulbactam (UNASYN) 3 g in sodium chloride 0.9 % 100 mL IVPB  3 g Intravenous Q6H Florencia Reasons, MD 200 mL/hr at 09/22/18 0546 3 g at 09/22/18 0546  . benzonatate (TESSALON) capsule 100 mg  100 mg Oral TID PRN Florencia Reasons, MD   100 mg at 09/17/18 1741  . cholecalciferol (VITAMIN D) tablet 1,000 Units  1,000 Units Oral Daily Florencia Reasons, MD   1,000 Units at 09/20/18 1057  . feeding supplement (ENSURE ENLIVE) (ENSURE ENLIVE) liquid 237 mL  237 mL Oral BID BM Annita Brod, MD   237 mL at 09/21/18 1231  . folic acid (FOLVITE) tablet 1 mg  1 mg Oral Daily Gevena Barre K, MD   1 mg at 09/20/18 1057  . furosemide (LASIX) injection 20 mg  20 mg Intravenous Q12H Annita Brod, MD   20 mg at 09/22/18 0543  . LORazepam (ATIVAN)  tablet 1 mg  1 mg Oral Q6H PRN Florencia Reasons, MD       Or  . LORazepam (ATIVAN) injection 1 mg  1 mg Intravenous Q6H PRN Florencia Reasons, MD   1 mg at 09/20/18 2346  . multivitamin with minerals tablet 1 tablet  1 tablet Oral Daily Annita Brod, MD   1 tablet at 09/20/18 1057  . ondansetron (ZOFRAN) tablet 4 mg  4 mg Oral Q6H PRN Annita Brod, MD   4 mg at 09/18/18 1025   Or  . ondansetron (ZOFRAN) injection 4 mg  4 mg Intravenous Q6H PRN Annita Brod, MD      . phytonadione (VITAMIN K) tablet 10 mg  10 mg Oral Daily Lucio Edward T, MD      . potassium chloride SA (K-DUR,KLOR-CON) CR tablet 40 mEq  40 mEq Oral Once Florencia Reasons, MD      . spironolactone (ALDACTONE) tablet 100 mg  100 mg Oral Daily Florencia Reasons, MD   100 mg at 09/20/18 1057  . thiamine  (VITAMIN B-1) tablet 100 mg  100 mg Oral Daily Annita Brod, MD   100 mg at 09/20/18 1057   Or  . thiamine (B-1) injection 100 mg  100 mg Intravenous Daily Annita Brod, MD      . vitamin B-12 (CYANOCOBALAMIN) tablet 1,000 mcg  1,000 mcg Oral Daily Florencia Reasons, MD   1,000 mcg at 09/20/18 1057     Discharge Medications: Please see discharge summary for a list of discharge medications.  Relevant Imaging Results:  Relevant Lab Results:   Additional Information [id number]  Weston Anna, LCSW
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
## 2018-09-22 NOTE — Plan of Care (Signed)
Plan of care reviewed. 

## 2018-09-22 NOTE — Progress Notes (Signed)
Pharmacy Antibiotic Note  Gregory Rowe is a 62 y.o. male with multiple liver lesions. Pharmacy has been consulted for Unasyn dosing for PNA/IAI.  Plan: Continue Unasyn 3 g iv q 6h.  No further dose adjustments needed, Pharmacy will sign off  Height: 5\' 6"  (167.6 cm) Weight: 160 lb 11.2 oz (72.9 kg) IBW/kg (Calculated) : 63.8  Temp (24hrs), Avg:99.3 F (37.4 C), Min:98.5 F (36.9 C), Max:101.2 F (38.4 C)  Recent Labs  Lab 09/15/18 1203  09/18/18 0428 09/18/18 1627 09/19/18 0457 09/20/18 0503 09/21/18 0522 09/22/18 0444  WBC 6.9  --  6.8  --   --   --  7.3 5.9  CREATININE 0.40   < > 0.43* 0.43* 0.44* 0.42* 0.43* 0.38*   < > = values in this interval not displayed.    Estimated Creatinine Clearance: 86.4 mL/min (A) (by C-G formula based on SCr of 0.38 mg/dL (L)).    Allergies  Allergen Reactions  . Other Other (See Comments)    NARCOTICS; makes him angry    Antimicrobials this admission: 11/14 ceftriaxone/azithromycin x 1 11/17 Unasyn >>  Dose adjustments this admission:  None  Microbiology results: 11/15 L Pleura: ngtd 11/16 UCx: NGF 11/16 Sputum: rare GPC, abundant candida albicans 11/15 MRSA PCR: negative 11/15 Urine strep Ag: neg 11/16 Hepatitis panel neg  Thank you for allowing pharmacy to be a part of this patient's care.  Peggyann Juba, PharmD, BCPS Pager: [phone] 09/22/2018 7:54 AM

## 2018-09-22 NOTE — Progress Notes (Signed)
Pt alert and oriented to name, calm, able to feed self with minimal assistance. Preparing for rehab in next couple days, pt tele sitter discontinued, will continue to monitor. Bed alarm set.  SRP, RN

## 2018-09-23 LAB — BASIC METABOLIC PANEL
Anion gap: 7 (ref 5–15)
BUN: 6 mg/dL — AB (ref 8–23)
CHLORIDE: 90 mmol/L — AB (ref 98–111)
CO2: 32 mmol/L (ref 22–32)
CREATININE: 0.46 mg/dL — AB (ref 0.61–1.24)
Calcium: 7.4 mg/dL — ABNORMAL LOW (ref 8.9–10.3)
GFR calc Af Amer: >60 mL/min (ref >60)
GFR calc non Af Amer: >60 mL/min (ref >60)
GLUCOSE: 82 mg/dL (ref 70–99)
Potassium: 3.4 mmol/L — ABNORMAL LOW (ref 3.5–5.1)
SODIUM: 129 mmol/L — AB (ref 135–145)

## 2018-09-23 LAB — CBC WITH DIFFERENTIAL/PLATELET
Abs Immature Granulocytes: 0.04 10*3/uL (ref 0.00–0.07)
Basophils Absolute: 0.1 10*3/uL (ref 0.0–0.1)
Basophils Relative: 1 %
Eosinophils Absolute: 0.2 10*3/uL (ref 0.0–0.5)
Eosinophils Relative: 4 %
HCT: 32.4 % — ABNORMAL LOW (ref 39.0–52.0)
Hemoglobin: 9.8 g/dL — ABNORMAL LOW (ref 13.0–17.0)
Immature Granulocytes: 1 %
Lymphocytes Relative: 25 %
Lymphs Abs: 1.6 10*3/uL (ref 0.7–4.0)
MCH: 25.3 pg — ABNORMAL LOW (ref 26.0–34.0)
MCHC: 30.2 g/dL (ref 30.0–36.0)
MCV: 83.5 fL (ref 80.0–100.0)
Monocytes Absolute: 0.9 10*3/uL (ref 0.1–1.0)
Monocytes Relative: 14 %
Neutro Abs: 3.4 10*3/uL (ref 1.7–7.7)
Neutrophils Relative %: 55 %
Platelets: 209 10*3/uL (ref 150–400)
RBC: 3.88 MIL/uL — ABNORMAL LOW (ref 4.22–5.81)
RDW: 28.7 % — ABNORMAL HIGH (ref 11.5–15.5)
WBC: 6.2 10*3/uL (ref 4.0–10.5)
nRBC: 0 % (ref 0.0–0.2)

## 2018-09-23 LAB — RENAL FUNCTION PANEL
Albumin: 1.6 g/dL — ABNORMAL LOW (ref 3.5–5.0)
Anion gap: 9 (ref 5–15)
BUN: 5 mg/dL — ABNORMAL LOW (ref 8–23)
CO2: 30 mmol/L (ref 22–32)
Calcium: 7.4 mg/dL — ABNORMAL LOW (ref 8.9–10.3)
Chloride: 90 mmol/L — ABNORMAL LOW (ref 98–111)
Creatinine, Ser: 0.37 mg/dL — ABNORMAL LOW (ref 0.61–1.24)
GFR calc Af Amer: >60 mL/min (ref >60)
GFR calc non Af Amer: >60 mL/min (ref >60)
Glucose, Bld: 83 mg/dL (ref 70–99)
Phosphorus: 3.8 mg/dL (ref 2.5–4.6)
Potassium: 3.4 mmol/L — ABNORMAL LOW (ref 3.5–5.1)
Sodium: 129 mmol/L — ABNORMAL LOW (ref 135–145)

## 2018-09-23 LAB — OSMOLALITY, URINE: Osmolality, Ur: 405 mOsm/kg (ref 300–900)

## 2018-09-23 LAB — TSH: TSH: 10.294 u[IU]/mL — ABNORMAL HIGH (ref 0.350–4.500)

## 2018-09-23 LAB — CORTISOL: Cortisol, Plasma: 3.7 ug/dL

## 2018-09-23 LAB — PROTIME-INR
INR: 1.47
PROTHROMBIN TIME: 17.7 s — AB (ref 11.4–15.2)

## 2018-09-23 LAB — OSMOLALITY: Osmolality: 267 mOsm/kg — ABNORMAL LOW (ref 275–295)

## 2018-09-23 LAB — T4, FREE: Free T4: 1.12 ng/dL (ref 0.82–1.77)

## 2018-09-23 LAB — MAGNESIUM: Magnesium: 2 mg/dL (ref 1.7–2.4)

## 2018-09-23 LAB — SODIUM, URINE, RANDOM: Sodium, Ur: 134 mmol/L

## 2018-09-23 MED ORDER — POTASSIUM CHLORIDE CRYS ER 20 MEQ PO TBCR
40.0000 meq | EXTENDED_RELEASE_TABLET | ORAL | Status: AC
  Administered 2018-09-23 (×2): 40 meq via ORAL
  Filled 2018-09-23 (×2): qty 2

## 2018-09-23 MED ORDER — LORAZEPAM 1 MG PO TABS: 1.0000 mg | ORAL_TABLET | Freq: Four times a day (QID) | ORAL | Status: DC | PRN

## 2018-09-23 MED ORDER — MAGNESIUM SULFATE 2 GM/50ML IV SOLN
2.0000 g | Freq: Once | INTRAVENOUS | Status: AC
  Administered 2018-09-23: 2 g via INTRAVENOUS
  Filled 2018-09-23: qty 50

## 2018-09-23 MED ORDER — MEGESTROL ACETATE 400 MG/10ML PO SUSP
400.0000 mg | Freq: Two times a day (BID) | ORAL | Status: DC
  Administered 2018-09-23: 400 mg via ORAL
  Filled 2018-09-23 (×2): qty 10

## 2018-09-23 MED ORDER — LORAZEPAM 2 MG/ML IJ SOLN
1.0000 mg | Freq: Four times a day (QID) | INTRAMUSCULAR | Status: DC | PRN
  Administered 2018-09-23 – 2018-09-24 (×2): 1 mg via INTRAVENOUS
  Filled 2018-09-23 (×2): qty 1

## 2018-09-23 MED ORDER — ENSURE ENLIVE PO LIQD
237.0000 mL | Freq: Three times a day (TID) | ORAL | Status: DC
  Administered 2018-09-24: 237 mL via ORAL

## 2018-09-23 MED ORDER — POTASSIUM CHLORIDE CRYS ER 20 MEQ PO TBCR
40.0000 meq | EXTENDED_RELEASE_TABLET | ORAL | Status: AC
  Administered 2018-09-23: 40 meq via ORAL
  Filled 2018-09-23 (×2): qty 2
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
## 2018-09-23 NOTE — Progress Notes (Signed)
Physical Therapy Treatment Patient Details Name: Gregory Rowe MRN: [id number] DOB: 1955-11-29 Today's Date: 09/23/2018    History of Present Illness Patient with a history of alcoholism who was recently admitted to the hospital for severe anemia with a hemoglobin of 3 requiring 5 units of packed red blood cells, hyponatremia related to beer potomania, found to have pleural effusions likely due to low oncotic pressures in the setting of malnutrition and alcohol use     PT Comments    Pt assisted with standing at EOB twice.  Pt fatigues quickly however and requires +2 assist for weakness and balance.  Pt incontinent of bowels during session and NT in to assist with hygiene and linen changes.  Continue to recommend SNF upon d/c.    Follow Up Recommendations  SNF     Equipment Recommendations  None recommended by PT    Recommendations for Other Services       Precautions / Restrictions Precautions Precautions: Fall Precaution Comments: incontinence of bowels    Mobility  Bed Mobility Overal bed mobility: Needs Assistance Bed Mobility: Rolling;Supine to Sit;Sit to Supine Rolling: Mod assist   Supine to sit: Mod assist Sit to supine: Mod assist   General bed mobility comments: pt able to self assist with bed mobility, prefers his own method to tolerate scrotal swelling and pain, required rolling in bed due to BM so placed pillow between knees for pt to better tolerate rolling  Transfers Overall transfer level: Needs assistance Equipment used: Rolling walker (2 wheeled) Transfers: Sit to/from Stand Sit to Stand: Mod assist;From elevated surface         General transfer comment: verbal cues for technique, pt required assist to rise and steady, only able to hold for approx one minute, performed twice  Ambulation/Gait                 Stairs             Wheelchair Mobility    Modified Rankin (Stroke Patients Only)       Balance                                             Cognition Arousal/Alertness: Awake/alert Behavior During Therapy: WFL for tasks assessed/performed Overall Cognitive Status: Within Functional Limits for tasks assessed                                        Exercises      General Comments        Pertinent Vitals/Pain Pain Assessment: Faces Faces Pain Scale: Hurts even more Pain Location: "everywhere" Pain Descriptors / Indicators: Discomfort;Tender;Sore Pain Intervention(s): Limited activity within patient's tolerance;Monitored during session;Repositioned    Home Living                      Prior Function            PT Goals (current goals can now be found in the care plan section) Progress towards PT goals: Progressing toward goals    Frequency    Min 2X/week      PT Plan Current plan remains appropriate    Co-evaluation              AM-PAC PT "6 Clicks" Daily Activity  Outcome  Measure  Difficulty turning over in bed (including adjusting bedclothes, sheets and blankets)?: Unable Difficulty moving from lying on back to sitting on the side of the bed? : Unable Difficulty sitting down on and standing up from a chair with arms (e.g., wheelchair, bedside commode, etc,.)?: Unable Help needed moving to and from a bed to chair (including a wheelchair)?: Total Help needed walking in hospital room?: Total Help needed climbing 3-5 steps with a railing? : Total 6 Click Score: 6    End of Session   Activity Tolerance: Patient limited by pain Patient left: in bed;with bed alarm set;with call bell/phone within reach;with nursing/sitter in room Nurse Communication: Mobility status PT Visit Diagnosis: Other abnormalities of gait and mobility (R26.89);Muscle weakness (generalized) (M62.81)     Time: 2355-7322 PT Time Calculation (min) (ACUTE ONLY): 38 min  Charges:  $Therapeutic Activity: 23-37 mins                    Carmelia Bake, PT,  DPT Acute Rehabilitation Services Office: [phone] Pager: [phone]  Trena Platt 09/23/2018, 4:11 PM
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
## 2018-09-23 NOTE — Progress Notes (Signed)
Nutrition Follow-up  DOCUMENTATION CODES:   Obesity unspecified  INTERVENTION:    Increase Ensure Enlive po to TID, each supplement provides 350 kcal and 20 grams of protein  Add Magic cup TID with meals, each supplement provides 290 kcal and 9 grams of protein  Provide MVI daily  NUTRITION DIAGNOSIS:   Inadequate oral intake related to inability to eat as evidenced by NPO status.  Diet advanced regular   GOAL:   Patient will meet greater than or equal to 90% of their needs  Not meeting   MONITOR:   Diet advancement, Weight trends, Labs, I & O's  REASON FOR ASSESSMENT:   Consult Assessment of nutrition requirement/status  ASSESSMENT:   62 y.o. male with medical history significant for recent hospitalization for severe anemia which was felt to be secondary to beer potomania. Patient was discharged on 11/10. Since being home, he continued to feel very weak. He also had follow-up outpatient blood work done which noted a low sodium so he was sent to the ED where repeat blood work did confirm his sodium level, which had been 127 mmol/L on discharge and now at 122 mmol/L. CXR showed an increase in size of L pleural effusion as well as appearances of trapped fluid.   11/15- left thoracentesis- 710 ml drained  Pt's intake has not progressed. Meal completions charted as 0-25% for his last 8 meals. He seemed somewhat confused upon RD visit, but states he does not want to eat because the food is not cooked how he likes it. Pt is drinking Ensure Enlive BID and is amendable to drinking more. Megace was started today. RD consulted for calorie count.   A calorie count envelope was placed on the patient's door. Nursing to document percent consumed for each item on the patient's meal tray ticket/supplement and keep in envelope.   Admission wt documented as 196 lb. Pt weighed this morning at 155 lb. Likely fluid loss as he was +12.6 L on 11/15. Will continue to monitor trends.    Medications reviewed and include: Vit D, folic acid, megace BID, MVI with minerals, thiamine Labs reviewed: Na 129 (L) K 3.4 (L) corrected calcium 9.3 (wdl)  Diet Order:   Diet Order            Diet regular Room service appropriate? Yes; Fluid consistency: Thin  Diet effective now              EDUCATION NEEDS:   No education needs have been identified at this time  Skin:  Skin Assessment: Reviewed RN Assessment  Last BM:  09/22/18  Height:   Ht Readings from Last 1 Encounters:  09/21/18 5\' 6"  (1.676 m)    Weight:   Wt Readings from Last 1 Encounters:  09/23/18 70.6 kg    Ideal Body Weight:  64.54 kg  BMI:  Body mass index is 25.12 kg/m.  Estimated Nutritional Needs:   Kcal:  1950-2130 kcal  Protein:  90-105 grams  Fluid:  >/= 1.5 L/day   Mariana Single RD, LDN Clinical Nutrition Pager # - [phone]

## 2018-09-23 NOTE — Progress Notes (Signed)
Spoke with long time friend Lavella Lemons [phone], girlfriend of patient of 20 years, request pt have referral to inpatient rehab, called SW to update. SRP, RN

## 2018-09-23 NOTE — Progress Notes (Signed)
Clinical Social Worker following patient and family for support and discharge needs. Patient at this time does not have any bed offers. CSW will continue to follow for bed availability .   [name], MSW,  [place]

## 2018-09-24 LAB — RENAL FUNCTION PANEL
Albumin: 3.7 g/dL (ref 3.5–5.0)
Anion gap: 8 (ref 5–15)
BUN: 13 mg/dL (ref 8–23)
CO2: 23 mmol/L (ref 22–32)
Calcium: 9.2 mg/dL (ref 8.9–10.3)
Chloride: 107 mmol/L (ref 98–111)
Creatinine, Ser: 1.04 mg/dL (ref 0.61–1.24)
GFR calc Af Amer: >60 mL/min (ref >60)
GFR calc non Af Amer: >60 mL/min (ref >60)
Glucose, Bld: 109 mg/dL — ABNORMAL HIGH (ref 70–99)
Phosphorus: 3.5 mg/dL (ref 2.5–4.6)
Potassium: 5 mmol/L (ref 3.5–5.1)
Sodium: 138 mmol/L (ref 135–145)

## 2018-09-24 LAB — PROTIME-INR
INR: 1.47
PROTHROMBIN TIME: 17.7 s — AB (ref 11.4–15.2)

## 2018-09-24 LAB — T3, FREE: T3, Free: 1.9 pg/mL — ABNORMAL LOW (ref 2.0–4.4)

## 2018-09-24 MED ORDER — CYANOCOBALAMIN 1000 MCG PO TABS: 1000.0000 ug | ORAL_TABLET | Freq: Every day | ORAL | 0 refills | Status: AC

## 2018-09-24 MED ORDER — FOLIC ACID 1 MG PO TABS: 1.0000 mg | ORAL_TABLET | Freq: Every day | ORAL | 0 refills | Status: AC

## 2018-09-24 MED ORDER — FUROSEMIDE 20 MG PO TABS: 20.0000 mg | ORAL_TABLET | Freq: Every day | ORAL | 11 refills | Status: AC

## 2018-09-24 MED ORDER — AMOXICILLIN-POT CLAVULANATE 875-125 MG PO TABS
1.0000 | ORAL_TABLET | Freq: Two times a day (BID) | ORAL | Status: DC
  Administered 2018-09-24: 1 via ORAL
  Filled 2018-09-24: qty 1

## 2018-09-24 MED ORDER — SPIRONOLACTONE 100 MG PO TABS: 100.0000 mg | ORAL_TABLET | Freq: Every day | ORAL | 0 refills | Status: AC

## 2018-09-24 MED ORDER — ENSURE ENLIVE PO LIQD: 237.0000 mL | Freq: Three times a day (TID) | ORAL | 12 refills | Status: AC

## 2018-09-24 MED ORDER — MEGESTROL ACETATE 400 MG/10ML PO SUSP: 400.0000 mg | Freq: Two times a day (BID) | ORAL | 0 refills | Status: AC

## 2018-09-24 MED ORDER — AMOXICILLIN-POT CLAVULANATE 875-125 MG PO TABS: 1.0000 | ORAL_TABLET | Freq: Two times a day (BID) | ORAL | 0 refills | Status: AC

## 2018-09-24 NOTE — Progress Notes (Signed)
Clinical Social Worker following patient for support and discharge needs. Patients son Gregory Rowe stated that he just found out from patients oncologist that he has cancer. Gregory Rowe stated that he will take patient home to spend his last couple months with the patient. CSW made MD and RNCM aware that Gregory Rowe is wanting patient to go home. CSW signing off as social work needs have been met.   [name], MSW,  [place]

## 2018-09-24 NOTE — Progress Notes (Signed)
Palliative Medicine consult noted. Due to high referral volume, there may be a delay seeing this patient. Please call the Palliative Medicine Team office at [phone] if recommendations are needed in the interim.  Thank you for inviting [place] to see this patient.  Marjie Skiff Yamen Castrogiovanni, RN, BSN, [facility] Palliative Medicine Team 09/24/2018 1:44 PM Office [phone]

## 2018-09-24 NOTE — Progress Notes (Signed)
Rehab Admissions Coordinator Note:  Patient was screened by Jhonnie Garner for appropriateness for an Inpatient Acute Rehab Consult.  At this time, pt is not appropriate for CIR. AC has communicated with CM. AC will discontinue IP rehab consult order.   Jhonnie Garner 09/24/2018, 9:30 AM  I can be reached at [phone].

## 2018-09-24 NOTE — Progress Notes (Signed)
Gregory Rowe   DOB:20-Jan-1956   JJ#:[id number]   GYJ#:[id number]  Subjective:  Mr Gregory Rowe had a rough night per RN notes. This AM he is lethargic. Opens eyes to voice and touch but does not answer verbally. -- Note when I have discussed the overall situation with Mr Depaz when he is alert he refers all decisions to his son Gregory Rowe. Accordingly I contacted the son this AM, as detailed below.   Objective: middle aged White man who appears older than stated age, examined in bed Vitals:   09/23/18 2347 09/24/18 0655  BP: 125/71 110/81  Pulse: 83 76  Resp: 18 20  Temp: 97.9 F (36.6 C) 97.7 F (36.5 C)  SpO2: 98% (!) 89%    Body mass index is 26.44 kg/m.  Intake/Output Summary (Last 24 hours) at 09/24/2018 0746 Last data filed at 09/24/2018 0650 Gross per 24 hour  Intake 340 ml  Output 4575 ml  Net -4235 ml     Lungs no rales or wheezes--auscultated anterolaterally  Heart regular rate and rhythm  Abdomen soft, +BS  Neuro nonfocal  CBG (last 3)  No results for input(s): GLUCAP in the last 72 hours.   Labs:  Lab Results  Component Value Date   WBC 6.2 09/23/2018   HGB 9.8 (L) 09/23/2018   HCT 32.4 (L) 09/23/2018   MCV 83.5 09/23/2018   PLT 209 09/23/2018   NEUTROABS 3.4 09/23/2018    @LASTCHEMISTRY @  Urine Studies No results for input(s): UHGB, CRYS in the last 72 hours.  Invalid input(s): UACOL, UAPR, USPG, UPH, UTP, UGL, UKET, UBIL, UNIT, UROB, ULEU, UEPI, UWBC, URBC, [place], [place], [place], [place]  Basic Metabolic Panel: Recent Labs  Lab 09/18/18 1627 09/19/18 0457  09/21/18 0522 09/22/18 0444 09/23/18 0453 09/23/18 0454 09/24/18 0340  NA 126* 128*   < > 132* 128* 129* 129* 138  K 3.7 3.9   < > 3.6 3.4* 3.4* 3.4* 5.0  CL 93* 94*   < > 93* 92* 90* 90* 107  CO2 26 30   < > 29 32 32 30 23  GLUCOSE 75 85   < > 49* 99 82 83 109*  BUN <5* <5*   < > <5* <5* 6* 5* 13  CREATININE 0.43* 0.44*   < > 0.43* 0.38* 0.46* 0.37* 1.04  CALCIUM 7.1* 7.3*   < > 7.3* 7.2*  7.4* 7.4* 9.2  MG 1.6* 2.1  --  1.4* 1.8  --  2.0  --   PHOS  --   --   --   --   --   --  3.8 3.5   < > = values in this interval not displayed.   GFR Estimated Creatinine Clearance: 66.5 mL/min (by C-G formula based on SCr of 1.04 mg/dL). Liver Function Tests: Recent Labs  Lab 09/18/18 0428 09/23/18 0454 09/24/18 0340  AST 26  --   --   ALT 12  --   --   ALKPHOS 92  --   --   BILITOT 2.1*  --   --   PROT 5.6*  --   --   ALBUMIN 1.8* 1.6* 3.7   No results for input(s): LIPASE, AMYLASE in the last 168 hours. Recent Labs  Lab 09/22/18 0444  AMMONIA 31   Coagulation profile Recent Labs  Lab 09/18/18 0428 09/20/18 0503 09/22/18 0444 09/23/18 0453 09/24/18 0340  INR 1.96 1.82 1.60 1.47 1.47    CBC: Recent Labs  Lab 09/18/18 0428 09/21/18 0522 09/22/18 0444  09/23/18 0454  WBC 6.8 7.3 5.9 6.2  NEUTROABS 4.0 4.5 3.3 3.4  HGB 9.7* 9.6* 9.0* 9.8*  HCT 32.7* 32.1* 30.1* 32.4*  MCV 81.3 81.9 83.6 83.5  PLT 210 222 200 209   Cardiac Enzymes: No results for input(s): CKTOTAL, CKMB, CKMBINDEX, TROPONINI in the last 168 hours. BNP: Invalid input(s): POCBNP CBG: Recent Labs  Lab 09/21/18 0635 09/21/18 0701  GLUCAP 33* 118*   D-Dimer No results for input(s): DDIMER in the last 72 hours. Hgb A1c No results for input(s): HGBA1C in the last 72 hours. Lipid Profile No results for input(s): CHOL, HDL, LDLCALC, TRIG, CHOLHDL, LDLDIRECT in the last 72 hours. Thyroid function studies Recent Labs    09/23/18 0453  TSH 10.294*  T3FREE 1.9*   Anemia work up No results for input(s): VITAMINB12, FOLATE, FERRITIN, TIBC, IRON, RETICCTPCT in the last 72 hours. Microbiology Recent Results (from the past 240 hour(s))  Body fluid culture     Status: None   Collection Time: 09/17/18  3:13 PM  Result Value Ref Range Status   Specimen Description   Final    Pleural, L Performed at [facility], [place] [address], [place], [place] [address]    Special  Requests   Final    NONE Performed at [facility], [place] [address], [place], [place] [address]    Gram Stain   Final    MODERATE WBC PRESENT, PREDOMINANTLY MONONUCLEAR NO ORGANISMS SEEN    Culture   Final    NO GROWTH 3 DAYS Performed at [place] [address] [place], [place] [address]    Report Status 09/21/2018 FINAL  Final  Expectorated sputum assessment w rflx to resp cult     Status: None   Collection Time: 09/17/18  5:10 PM  Result Value Ref Range Status   Specimen Description EXPECTORATED SPUTUM  Final   Special Requests NONE  Final   Sputum evaluation   Final    Sputum specimen not acceptable for testing.  Please recollect.   NOTIFIED L.PATRAM AT 1941 ON 09/17/18 BY N.THOMPSON Performed at [facility], [place] [address], [place], [place] [address]    Report Status 09/17/2018 FINAL  Final  MRSA PCR Screening     Status: None   Collection Time: 09/17/18  5:11 PM  Result Value Ref Range Status   MRSA by PCR NEGATIVE NEGATIVE Final    Comment:        The GeneXpert MRSA Assay (FDA approved for NASAL specimens only), is one component of a comprehensive MRSA colonization surveillance program. It is not intended to diagnose MRSA infection nor to guide or monitor treatment for MRSA infections. Performed at [facility], [place] [address] [place], [place] [address]   Expectorated sputum assessment w rflx to resp cult     Status: None   Collection Time: 09/18/18 10:49 AM  Result Value Ref Range Status   Specimen Description SPUTUM  Final   Special Requests NONE  Final   Sputum evaluation   Final    THIS SPECIMEN IS ACCEPTABLE FOR SPUTUM CULTURE Performed at [facility], [place] [address], [place], [place] [address]    Report Status 09/18/2018 FINAL  Final  Culture, respiratory     Status: None   Collection Time: 09/18/18 10:49 AM  Result Value Ref Range Status   Specimen Description    Final    SPUTUM Performed at [place]  [address] [place], [place] [address]    Special Requests   Final    NONE Reflexed from [phone] Performed at [facility], [place] [address], [place], [place] [address]    Gram Stain   Final    FEW SQUAMOUS EPITHELIAL CELLS PRESENT NO WBC SEEN RARE GRAM POSITIVE COCCI Performed at [facility], [place] [address] [place], [place] [address]    Culture ABUNDANT CANDIDA ALBICANS  Final   Report Status 09/21/2018 FINAL  Final  Culture, Urine     Status: None   Collection Time: 09/18/18  9:01 PM  Result Value Ref Range Status   Specimen Description   Final    URINE, RANDOM Performed at [facility], [place] [address], [place], [place] [address]    Special Requests   Final    NONE Performed at [facility], [place] [address], [place], [place] [address]    Culture   Final    NO GROWTH Performed at [facility], [place] [address] [place],  [address]    Report Status 09/20/2018 FINAL  Final      Studies:  Dg Chest 1 View  Result Date: 09/17/2018 CLINICAL DATA:  Status post LEFT thoracentesis. EXAM: CHEST  1 VIEW COMPARISON:  Chest x-ray dated 09/15/2018. FINDINGS: Significantly improved aeration at the LEFT lung base status post thoracentesis. No pneumothorax seen. Patchy opacities at the RIGHT lung base, mostly due to loculated pleural effusion within the RIGHT lung fissure as demonstrated on chest CT of 09/16/2018. Heart size and mediastinal contours appear grossly stable. 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 IMPRESSION: 1. Significantly improved aeration at the LEFT lung base status post thoracentesis. No pneumothorax. 2. 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 Electronically Signed   By: Franki Cabot M.D.   On: 09/17/2018  15:52   Dg Chest 2 View  Result Date: 09/15/2018 CLINICAL DATA:  Recent hospitalization for transfusions secondary to anemia. History of fluid on the lungs. EXAM: CHEST - 2 VIEW COMPARISON:  Portable chest x-ray of September 09, 2018 FINDINGS: There is increased volume loss on the left with moderate sized left pleural effusion. Progressive atelectasis or pneumonia in the left lower lobe is present. On the right pleural fluid has filled the major fissure and some atelectasis medially persists. The cardiac silhouette is enlarged. The pulmonary vascularity is not clearly engorged. There is calcification in the wall of the aortic arch. There is calcification of the anterior longitudinal ligament of the thoracic spine. IMPRESSION: Increased density in the left retrocardiac region with moderate-sized left pleural effusion. Atelectasis or pneumonia here is suspected. Probable loculated fluid along the major fissure on the right medially. Chest CT scanning is recommended to better evaluate the pulmonary parenchyma and the pleural spaces. Electronically Signed   By: David  Martinique M.D.   On: 09/15/2018 11:38   Ct Chest Wo Contrast  Result Date: 09/16/2018 CLINICAL DATA:  Acute onset of shortness of breath on exertion. Hypokalemia and hyponatremia. Anemia. Right anterior rib pain. Assess left-sided pleural effusion. EXAM: CT CHEST WITHOUT CONTRAST TECHNIQUE: Multidetector CT imaging of the chest was performed following the standard protocol without IV contrast. COMPARISON:  Chest radiograph performed 09/15/2018 FINDINGS: Cardiovascular: The heart is mildly enlarged. Scattered coronary artery calcifications are seen. Mild calcification is noted at the aortic arch and proximal great vessels. Mediastinum/Nodes: A small pericardial effusion is  noted. No mediastinal lymphadenopathy is seen. The mediastinum is difficult to fully characterize without contrast. The thyroid gland is grossly unremarkable. No axillary  lymphadenopathy is appreciated. Lungs/Pleura: A small right-sided pleural effusion is noted, somewhat loculated in appearance. Fluid is seen tracking along the right major fissure. There is also a small to moderate left-sided pleural effusion, with consolidation of the left lower lobe. Pneumonia is a concern. Underlying mass cannot be excluded. Upper Abdomen: There is somewhat unusual enlargement of the caudate lobe of the liver to 7.3 cm, with associated heterogeneity. The visualized portions of the spleen are unremarkable. Stones are noted within the gallbladder; the gallbladder is largely contracted, with minimal nonspecific edema about the gallbladder. The visualized portions of the adrenal glands and kidneys are grossly unremarkable. Musculoskeletal: There is suggestion of minimally displaced fractures of the right anterior fourth through sixth ribs. There appears to be 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, incompletely imaged on this study. The visualized musculature is unremarkable in appearance. IMPRESSION: 1. Small right-sided pleural effusion, somewhat loculated in appearance. Fluid tracking along the right major fissure. Small to moderate left-sided pleural effusion, with consolidation of the left lower lobe. Pneumonia is a concern. Underlying mass cannot be excluded, given the pleural effusion. 2. Suggestion of minimally displaced fractures of the right anterior fourth through sixth ribs. 3. Small pericardial effusion noted. 4. Somewhat unusual enlargement of the caudate lobe of the liver to 7.3 cm, with associated heterogeneity. Would correlate with LFTs, and consider dynamic liver protocol MRI or CT for further evaluation, when and as deemed clinically appropriate. 5. Cholelithiasis. Gallbladder largely contracted, with minimal nonspecific edema about the gallbladder. Electronically Signed   By: Garald Balding M.D.   On: 09/16/2018 01:29   Ct Abdomen Pelvis W Contrast  Result Date:  09/18/2018 CLINICAL DATA:  Right upper quadrant abdominal pain. Clinical concern for cholecystitis. Alcohol abuse. EXAM: CT ABDOMEN AND PELVIS WITH CONTRAST TECHNIQUE: Multidetector CT imaging of the abdomen and pelvis was performed using the standard protocol following bolus administration of intravenous contrast. CONTRAST:  178mL OMNIPAQUE IOHEXOL 300 MG/ML  SOLN COMPARISON:  Limited right upper quadrant abdomen ultrasound obtained yesterday. Chest CT dated 09/16/2018. FINDINGS: Lower chest: No significant change in a pericardial effusion measuring 1.7 cm in maximum thickness. The heart remains enlarged. Moderate-sized left and small to moderate-sized right pleural effusions are again demonstrated with partial loculation on right. Associated bilateral lower lobe compressive atelectasis is also again demonstrated. Hepatobiliary: On today's contrast examination, multiple liver masses are demonstrated. The previously demonstrated caudate lobe mass measures 8.8 x 7.1 cm on image number 23 series 3. A left lobe mass measures 7.9 x 5.8 cm on image number 19 series 3 with 2 adjacent smaller masses. A right lobe mass measures 8.5 x 5.0 cm on image number 30 series 3 with an extension toward the porta hepatis. A 9 mm gallstone is demonstrated in the gallbladder as well as smaller adjacent gallstones. No gallbladder wall thickening or pericholecystic fluid is seen. Pancreas: Unremarkable. No pancreatic ductal dilatation or surrounding inflammatory changes. Spleen: Normal in size without focal abnormality. Adrenals/Urinary Tract: Normal appearing adrenal glands, kidneys and ureters. Foley catheter in the urinary bladder with a small amount of associated air in the bladder and no significant urine. Stomach/Bowel: Large number of sigmoid colon diverticula without evidence of diverticulitis. No evidence of appendicitis. Unremarkable stomach and small bowel. Vascular/Lymphatic: Atheromatous arterial calcifications. Enlarged  aortocaval lymph node with a short axis diameter of 16 mm on image number  35 series 3. Additional smaller enlarged aortocaval lymph nodes. There are also mildly enlarged retrocaval nodes, the largest with a short axis diameter 9 mm on image number 39 series 3. Mildly enlarged gastrohepatic ligament node with a short axis diameter 8 mm on image number 25 series 3. Reproductive: Prostate is unremarkable. Other: Diffuse subcutaneous edema. Minimal peritoneal fluid. Small umbilical hernia containing fat. Musculoskeletal: Proximal right femur fixation hardware. Lumbar and lower thoracic spine degenerative changes and mild scoliosis. Approximately 40% L3 vertebral compression deformity with Schmorl's node formation inferiorly with no acute fracture lines and no significant bony retropulsion. Stable approximately 15% old T12 superior endplate compression deformity. IMPRESSION: 1. Multiple liver masses. Most of these are large. Differential considerations include multifocal hepatocellular carcinoma and metastases. 2. Mild retroperitoneal and gastrohepatic ligament adenopathy suspicious for metastatic adenopathy. 3. Stable moderate-sized left and small to moderate-sized right pleural effusions with partial loculation on the right. 4. Stable moderate-sized pericardial effusion. 5. Cholelithiasis without evidence of acute cholecystitis. 6. Diffuse subcutaneous edema and minimal free peritoneal fluid. 7. Extensive sigmoid diverticulosis. Electronically Signed   By: Claudie Revering M.D.   On: 09/18/2018 16:54   US Abdomen Limited  Result Date: 09/17/2018 CLINICAL DATA:  Liver mass. EXAM: ULTRASOUND ABDOMEN LIMITED RIGHT UPPER QUADRANT COMPARISON:  Chest CT 09/16/2018. FINDINGS: Gallbladder: Gallstones are evident with gallbladder wall thickness measuring 3 mm, upper normal. Sonographer reports a positive sonographic Murphy sign. Common bile duct: Diameter: 2 mm Liver: Liver parenchyma markedly heterogeneous with ill-defined  areas of increased echogenicity. 7 cm central liver lesion identified corresponding to the caudate lobe abnormality seen on recent chest CT. Portal vein is patent on color Doppler imaging with normal direction of blood flow towards the liver. IMPRESSION: 1. Cholelithiasis with borderline gallbladder wall thickening and positive sonographic Murphy sign. Acute cholecystitis a distinct concern. 2. Heterogeneous liver parenchyma with central liver mass corresponding to caudate lobe abnormality seen on recent chest CT. Dedicated abdomen and pelvis CT with oral and intravenous contrast recommended to further evaluate. Electronically Signed   By: Misty Stanley M.D.   On: 09/17/2018 21:16   US Biopsy (liver)  Result Date: 09/20/2018 INDICATION: No known primary, now with multiple liver lesions/masses worrisome for metastatic disease versus multifocal hepatocellular carcinoma (patient with history of alcohol abuse). Please perform ultrasound-guided liver lesion biopsy for tissue diagnostic purposes. EXAM: ULTRASOUND GUIDED LIVER LESION BIOPSY COMPARISON:  CT abdomen pelvis - 09/18/2018; chest CT - 09/16/2028 MEDICATIONS: None ANESTHESIA/SEDATION: Fentanyl 100 mcg IV; Versed 2 mg IV Total Moderate Sedation time:  13 Minutes. The patient's level of consciousness and vital signs were monitored continuously by radiology nursing throughout the procedure under my direct supervision. COMPLICATIONS: None immediate. PROCEDURE: Informed written consent was obtained from the patient after a discussion of the risks, benefits and alternatives to treatment. The patient understands and consents the procedure. A timeout was performed prior to the initiation of the procedure. Ultrasound scanning was performed of the right upper abdominal quadrant demonstrates multiple mixed echogenic lesions and masses scattered within the liver compatible the findings seen on preceding abdominal CT. Dominant ill-defined mixed echogenic mass within left  lobe of the liver measuring at least 8.2 x 6.3 cm (image 23), was targeted for biopsy given lesion location and sonographic window. The procedure was planned. The midline of the abdomen was prepped and draped in the usual sterile fashion. The overlying soft tissues were anesthetized with 1% lidocaine with epinephrine. A 17 gauge, 6.8 cm co-axial needle was advanced into a peripheral aspect  of the lesion. This was followed by 5 core biopsies with an 18 gauge core device under direct ultrasound guidance. Multiple ultrasound images were saved for procedural documentation purposes. The coaxial needle tract was embolized with a small amount of Gel-Foam slurry and superficial hemostasis was obtained with manual compression. Post procedural scanning was negative for definitive area of hemorrhage or additional complication. A dressing was placed. The patient tolerated the procedure well without immediate post procedural complication. IMPRESSION: Technically successful ultrasound guided core needle biopsy of infiltrative mass with left lobe of the liver. Electronically Signed   By: Sandi Mariscal M.D.   On: 09/20/2018 16:16   US Venous Img Lower Bilateral  Result Date: 09/15/2018 CLINICAL DATA:  Bilateral lower extremity edema. History of recent hospitalization. Evaluate for DVT. EXAM: BILATERAL LOWER EXTREMITY VENOUS DOPPLER ULTRASOUND TECHNIQUE: Gray-scale sonography with graded compression, as well as color Doppler and duplex ultrasound were performed to evaluate the lower extremity deep venous systems from the level of the common femoral vein and including the common femoral, femoral, profunda femoral, popliteal and calf veins including the posterior tibial, peroneal and gastrocnemius veins when visible. The superficial great saphenous vein was also interrogated. Spectral Doppler was utilized to evaluate flow at rest and with distal augmentation maneuvers in the common femoral, femoral and popliteal veins. COMPARISON:   None. FINDINGS: RIGHT LOWER EXTREMITY Common Femoral Vein: No evidence of thrombus. Normal compressibility, respiratory phasicity and response to augmentation. Saphenofemoral Junction: No evidence of thrombus. Normal compressibility and flow on color Doppler imaging. Profunda Femoral Vein: No evidence of thrombus. Normal compressibility and flow on color Doppler imaging. Femoral Vein: No evidence of thrombus. Normal compressibility, respiratory phasicity and response to augmentation. Popliteal Vein: No evidence of thrombus. Normal compressibility, respiratory phasicity and response to augmentation. Calf Veins: No evidence of thrombus. Normal compressibility and flow on color Doppler imaging. Superficial Great Saphenous Vein: No evidence of thrombus. Normal compressibility. Venous Reflux:  None. Other Findings: There is a large amount of subcutaneous edema the level of the right lower leg and calf. Note is made of a serpiginous approximately 1.6 x 5.1 x 2.7 cm fluid collection within the right popliteal fossa favored to represent a Baker cyst. LEFT LOWER EXTREMITY Common Femoral Vein: No evidence of thrombus. Normal compressibility, respiratory phasicity and response to augmentation. Saphenofemoral Junction: No evidence of thrombus. Normal compressibility and flow on color Doppler imaging. Profunda Femoral Vein: No evidence of thrombus. Normal compressibility and flow on color Doppler imaging. Femoral Vein: No evidence of thrombus. Normal compressibility, respiratory phasicity and response to augmentation. Popliteal Vein: No evidence of thrombus. Normal compressibility, respiratory phasicity and response to augmentation. Calf Veins: No evidence of thrombus. Normal compressibility and flow on color Doppler imaging. Superficial Great Saphenous Vein: No evidence of thrombus. Normal compressibility. Venous Reflux:  None. Other Findings: There is a large amount of subcutaneous edema at the level the left lower leg and  calf. IMPRESSION: No evidence of DVT within either lower extremity. Electronically Signed   By: Sandi Mariscal M.D.   On: 09/15/2018 11:44   Dg Chest Port 1 View  Result Date: 09/09/2018 CLINICAL DATA:  Weakness and decreased energy for 6 months. EXAM: PORTABLE CHEST 1 VIEW COMPARISON:  None. FINDINGS: There is cardiomegaly without edema. Small to moderate pleural effusions are present, greater on the right. There is also airspace disease in the right mid and lower lung zones and left lung base. Aortic atherosclerosis is noted. No acute or focal bony abnormality. Heterotopic ossification adjacent  to the proximal left humerus is partially imaged and may be due to old fracture. IMPRESSION: Right greater than left effusions and airspace disease which could be atelectasis or pneumonia. Cardiomegaly without edema. Atherosclerosis. Electronically Signed   By: Inge Rise M.D.   On: 09/09/2018 14:35   US Thoracentesis Asp Pleural Space W/img Guide  Result Date: 09/17/2018 INDICATION: Patient with history of alcohol abuse, cough, dyspnea, left pleural effusion. Request made for diagnostic and therapeutic left thoracentesis. EXAM: ULTRASOUND GUIDED DIAGNOSTIC AND THERAPEUTIC LEFT THORACENTESIS MEDICATIONS: None COMPLICATIONS: None immediate. PROCEDURE: An ultrasound guided thoracentesis was thoroughly discussed with the patient and questions answered. The benefits, risks, alternatives and complications were also discussed. The patient understands and wishes to proceed with the procedure. Written consent was obtained. Ultrasound was performed to localize and mark an adequate pocket of fluid in the left chest. The area was then prepped and draped in the normal sterile fashion. 1% Lidocaine was used for local anesthesia. Under ultrasound guidance a 6 Fr Safe-T-Centesis catheter was introduced. Thoracentesis was performed. The catheter was removed and a dressing applied. FINDINGS: A total of approximately 710 cc of  yellow fluid was removed. Samples were sent to the laboratory as requested by the clinical team. IMPRESSION: Successful ultrasound guided diagnostic and therapeutic left thoracentesis yielding 710 cc of pleural fluid. Read by: Rowe Robert, PA-C Electronically Signed   By: Jacqulynn Cadet M.D.   On: 09/17/2018 15:50    Assessment: 62 y.o. 62 y.o. 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  Gregory Rowe feels his father would not be able to tolerate chemotherapy and would not want it. He feels a best supportive care/ Hospice option is best for him. I agree.  Accordingly suggest (a) consult to Palliative Care to assist patient/family in changing from full code to DNR (written) (b) SNF placement if possible--there is not enough support at patient's home (if discharged to home he is likely to be readmitted within a few days) (c) referral to Hospice and Palliative Care at discharge (d) if DNR written would stop lab draws and any interventions not directly aimed at making sure patient is safe  and comfortable  Will follow with you.   Chauncey Cruel, MD 09/24/2018  7:46 AM Medical Oncology and Hematology [facility] [address] [place], [place] [address] Tel. [phone]    Fax. [phone]

## 2018-09-24 NOTE — Progress Notes (Signed)
Pt refused all oral meds this am. MD aware. Mehgan Santmyer, Bing Neighbors, RN

## 2018-09-24 NOTE — Progress Notes (Addendum)
Calorie Count Note  48 hour calorie count ordered.  Diet: Regular Supplements: Ensure Enlive po to TID, Magic cup TID with meals  Day 1 results- 11/21 Breakfast: 185 kcal, 9 grams protein Lunch: 20 kcal, 2 grams protein Dinner: 130 kcal, 6 grams protein Supplements: 262 kcal 15 grams   Estimated Nutritional Needs:  Kcal:  1950-2130 kcal Protein:  90-105 grams Fluid:  >/= 1.5 L/day  Total intake: 597 kcal (31% of minimum estimated needs)  32 protein (36% of minimum estimated needs)  Nutrition Dx: Inadequate oral intake related to inability to eat as evidenced by NPO status- resolved   Goal: Patient will meet greater than or equal to 90% of their needs- not meeting  Intervention:    Ensure Enlive po to TID, each supplement provides 350 kcal and 20 grams of protein  Magic cup TID with meals, each supplement provides 290 kcal and 9 grams of protein  Provide MVI daily   Gregory Rowe RD, LDN Clinical Nutrition Pager # [phone]
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
## 2018-11-01 ENCOUNTER — Other Ambulatory Visit: Payer: Self-pay

## 2018-11-01 ENCOUNTER — Emergency Department (HOSPITAL_COMMUNITY): Payer: Medicaid Other

## 2018-11-01 ENCOUNTER — Inpatient Hospital Stay (HOSPITAL_COMMUNITY)
Admission: EM | Admit: 2018-11-01 | Discharge: 2018-11-03 | DRG: 394 | Disposition: E | Payer: Medicaid Other | Attending: Internal Medicine | Admitting: Internal Medicine

## 2018-11-01 ENCOUNTER — Encounter (HOSPITAL_COMMUNITY): Payer: Self-pay | Admitting: Emergency Medicine

## 2018-11-01 DIAGNOSIS — R000 Tachycardia, unspecified: Secondary | ICD-10-CM | POA: Diagnosis present

## 2018-11-01 DIAGNOSIS — F17290 Nicotine dependence, other tobacco product, uncomplicated: Secondary | ICD-10-CM | POA: Diagnosis present

## 2018-11-01 DIAGNOSIS — Z885 Allergy status to narcotic agent status: Secondary | ICD-10-CM | POA: Diagnosis not present

## 2018-11-01 DIAGNOSIS — J90 Pleural effusion, not elsewhere classified: Secondary | ICD-10-CM | POA: Diagnosis present

## 2018-11-01 DIAGNOSIS — I959 Hypotension, unspecified: Secondary | ICD-10-CM | POA: Diagnosis present

## 2018-11-01 DIAGNOSIS — Z515 Encounter for palliative care: Secondary | ICD-10-CM | POA: Diagnosis present

## 2018-11-01 DIAGNOSIS — C189 Malignant neoplasm of colon, unspecified: Secondary | ICD-10-CM | POA: Diagnosis present

## 2018-11-01 DIAGNOSIS — C787 Secondary malignant neoplasm of liver and intrahepatic bile duct: Secondary | ICD-10-CM | POA: Diagnosis present

## 2018-11-01 DIAGNOSIS — K631 Perforation of intestine (nontraumatic): Principal | ICD-10-CM | POA: Diagnosis present

## 2018-11-01 DIAGNOSIS — N179 Acute kidney failure, unspecified: Secondary | ICD-10-CM | POA: Diagnosis present

## 2018-11-01 DIAGNOSIS — E872 Acidosis: Secondary | ICD-10-CM | POA: Diagnosis present

## 2018-11-01 DIAGNOSIS — Z79899 Other long term (current) drug therapy: Secondary | ICD-10-CM | POA: Diagnosis not present

## 2018-11-01 DIAGNOSIS — E860 Dehydration: Secondary | ICD-10-CM | POA: Diagnosis present

## 2018-11-01 DIAGNOSIS — D62 Acute posthemorrhagic anemia: Secondary | ICD-10-CM | POA: Diagnosis present

## 2018-11-01 DIAGNOSIS — F1020 Alcohol dependence, uncomplicated: Secondary | ICD-10-CM | POA: Diagnosis present

## 2018-11-01 DIAGNOSIS — R198 Other specified symptoms and signs involving the digestive system and abdomen: Secondary | ICD-10-CM

## 2018-11-01 DIAGNOSIS — Z66 Do not resuscitate: Secondary | ICD-10-CM | POA: Diagnosis present

## 2018-11-01 DIAGNOSIS — R601 Generalized edema: Secondary | ICD-10-CM | POA: Diagnosis present

## 2018-11-01 DIAGNOSIS — D649 Anemia, unspecified: Secondary | ICD-10-CM | POA: Diagnosis present

## 2018-11-01 HISTORY — DX: Malignant neoplasm of liver, not specified as primary or secondary: C22.9

## 2018-11-01 LAB — COMPREHENSIVE METABOLIC PANEL
ALBUMIN: 2.1 g/dL — AB (ref 3.5–5.0)
ALT: 10 U/L (ref 0–44)
ANION GAP: 15 (ref 5–15)
AST: 28 U/L (ref 15–41)
Alkaline Phosphatase: 64 U/L (ref 38–126)
BUN: 19 mg/dL (ref 8–23)
CALCIUM: 7.5 mg/dL — AB (ref 8.9–10.3)
CO2: 17 mmol/L — ABNORMAL LOW (ref 22–32)
Chloride: 98 mmol/L (ref 98–111)
Creatinine, Ser: 1.54 mg/dL — ABNORMAL HIGH (ref 0.61–1.24)
GFR calc Af Amer: 55 mL/min — ABNORMAL LOW (ref >60)
GFR calc non Af Amer: 48 mL/min — ABNORMAL LOW (ref >60)
Glucose, Bld: 77 mg/dL (ref 70–99)
Potassium: 3 mmol/L — ABNORMAL LOW (ref 3.5–5.1)
Sodium: 130 mmol/L — ABNORMAL LOW (ref 135–145)
Total Bilirubin: 0.8 mg/dL (ref 0.3–1.2)
Total Protein: 5.6 g/dL — ABNORMAL LOW (ref 6.5–8.1)

## 2018-11-01 LAB — CBC WITH DIFFERENTIAL/PLATELET
BASOS PCT: 0 %
BLASTS: 0 %
Band Neutrophils: 10 %
Basophils Absolute: 0 10*3/uL (ref 0.0–0.1)
EOS PCT: 0 %
Eosinophils Absolute: 0 10*3/uL (ref 0.0–0.5)
HEMATOCRIT: 23 % — AB (ref 39.0–52.0)
Hemoglobin: 6.8 g/dL — CL (ref 13.0–17.0)
LYMPHS ABS: 1.3 10*3/uL (ref 0.7–4.0)
Lymphocytes Relative: 14 %
MCH: 26.9 pg (ref 26.0–34.0)
MCHC: 29.6 g/dL — AB (ref 30.0–36.0)
MCV: 90.9 fL (ref 80.0–100.0)
MONO ABS: 0.5 10*3/uL (ref 0.1–1.0)
MONOS PCT: 5 %
Metamyelocytes Relative: 6 %
Myelocytes: 4 %
NEUTROS PCT: 61 %
NRBC: 0 % (ref 0.0–0.2)
NRBC: 0 /100{WBCs}
Neutro Abs: 7.8 10*3/uL — ABNORMAL HIGH (ref 1.7–7.7)
Other: 0 %
Platelets: 440 10*3/uL — ABNORMAL HIGH (ref 150–400)
Promyelocytes Relative: 0 %
RBC: 2.53 MIL/uL — ABNORMAL LOW (ref 4.22–5.81)
RDW: 18.3 % — AB (ref 11.5–15.5)
WBC: 9.6 10*3/uL (ref 4.0–10.5)

## 2018-11-01 LAB — I-STAT CG4 LACTIC ACID, ED
LACTIC ACID, VENOUS: 4.19 mmol/L — AB (ref 0.5–1.9)
Lactic Acid, Venous: 7.52 mmol/L (ref 0.5–1.9)

## 2018-11-01 LAB — LIPASE, BLOOD: Lipase: 26 U/L (ref 11–51)

## 2018-11-01 LAB — AMMONIA: Ammonia: 16 umol/L (ref 9–35)

## 2018-11-01 MED ORDER — LORAZEPAM 2 MG/ML IJ SOLN
1.0000 mg | Freq: Once | INTRAMUSCULAR | Status: AC
  Administered 2018-11-01: 1 mg via INTRAVENOUS
  Filled 2018-11-01: qty 1

## 2018-11-01 MED ORDER — LORAZEPAM 2 MG/ML IJ SOLN
0.0000 mg | Freq: Four times a day (QID) | INTRAMUSCULAR | Status: DC
  Administered 2018-11-01: 1 mg via INTRAVENOUS
  Filled 2018-11-01: qty 1

## 2018-11-01 MED ORDER — ONDANSETRON HCL 4 MG PO TABS: 4.0000 mg | ORAL_TABLET | Freq: Four times a day (QID) | ORAL | Status: DC | PRN

## 2018-11-01 MED ORDER — ADULT MULTIVITAMIN W/MINERALS CH: 1.0000 | ORAL_TABLET | Freq: Every day | ORAL | Status: DC

## 2018-11-01 MED ORDER — ACETAMINOPHEN 325 MG PO TABS: 650.0000 mg | ORAL_TABLET | Freq: Four times a day (QID) | ORAL | Status: DC | PRN

## 2018-11-01 MED ORDER — THIAMINE HCL 100 MG/ML IJ SOLN: 100.0000 mg | Freq: Every day | INTRAMUSCULAR | Status: DC

## 2018-11-01 MED ORDER — FENTANYL CITRATE (PF) 100 MCG/2ML IJ SOLN
25.0000 ug | Freq: Once | INTRAMUSCULAR | Status: AC
  Administered 2018-11-01: 25 ug via INTRAVENOUS
  Filled 2018-11-01: qty 2

## 2018-11-01 MED ORDER — IOPAMIDOL (ISOVUE-300) INJECTION 61%
INTRAVENOUS | Status: AC
  Filled 2018-11-01: qty 100

## 2018-11-01 MED ORDER — LORAZEPAM 2 MG/ML IJ SOLN: 1.0000 mg | INTRAMUSCULAR | Status: DC | PRN

## 2018-11-01 MED ORDER — SODIUM CHLORIDE (PF) 0.9 % IJ SOLN
INTRAMUSCULAR | Status: AC
  Filled 2018-11-01: qty 50

## 2018-11-01 MED ORDER — LORAZEPAM 2 MG/ML IJ SOLN
1.0000 mg | Freq: Four times a day (QID) | INTRAMUSCULAR | Status: DC | PRN
  Administered 2018-11-01: 1 mg via INTRAVENOUS
  Filled 2018-11-01: qty 1

## 2018-11-01 MED ORDER — FENTANYL CITRATE (PF) 100 MCG/2ML IJ SOLN
50.0000 ug | INTRAMUSCULAR | Status: DC | PRN
  Administered 2018-11-01: 50 ug via INTRAVENOUS
  Filled 2018-11-01: qty 2

## 2018-11-01 MED ORDER — FENTANYL CITRATE (PF) 100 MCG/2ML IJ SOLN
50.0000 ug | INTRAMUSCULAR | Status: DC | PRN
  Administered 2018-11-01: 50 ug via INTRAVENOUS
  Administered 2018-11-01 (×2): 25 ug via INTRAVENOUS
  Filled 2018-11-01 (×2): qty 2

## 2018-11-01 MED ORDER — ONDANSETRON HCL 4 MG/2ML IJ SOLN: 4.0000 mg | Freq: Four times a day (QID) | INTRAMUSCULAR | Status: DC | PRN

## 2018-11-01 MED ORDER — FOLIC ACID 1 MG PO TABS: 1.0000 mg | ORAL_TABLET | Freq: Every day | ORAL | Status: DC

## 2018-11-01 MED ORDER — ONDANSETRON HCL 4 MG/2ML IJ SOLN
4.0000 mg | Freq: Once | INTRAMUSCULAR | Status: AC
  Administered 2018-11-01: 4 mg via INTRAVENOUS
  Filled 2018-11-01: qty 2

## 2018-11-01 MED ORDER — ACETAMINOPHEN 650 MG RE SUPP: 650.0000 mg | Freq: Four times a day (QID) | RECTAL | Status: DC | PRN

## 2018-11-01 MED ORDER — PROMETHAZINE HCL 25 MG/ML IJ SOLN
12.5000 mg | Freq: Once | INTRAMUSCULAR | Status: AC
  Administered 2018-11-01: 12.5 mg via INTRAVENOUS
  Filled 2018-11-01: qty 1

## 2018-11-01 MED ORDER — FENTANYL CITRATE (PF) 100 MCG/2ML IJ SOLN
25.0000 ug | Freq: Once | INTRAMUSCULAR | Status: AC
  Administered 2018-11-01: 06:00:00 via INTRAVENOUS
  Filled 2018-11-01: qty 2

## 2018-11-01 MED ORDER — VITAMIN B-1 100 MG PO TABS: 100.0000 mg | ORAL_TABLET | Freq: Every day | ORAL | Status: DC

## 2018-11-01 MED ORDER — SODIUM CHLORIDE 0.9 % IV BOLUS
2000.0000 mL | Freq: Once | INTRAVENOUS | Status: AC
  Administered 2018-11-01: 1000 mL via INTRAVENOUS

## 2018-11-01 MED ORDER — LORAZEPAM 2 MG/ML IJ SOLN: 0.0000 mg | Freq: Two times a day (BID) | INTRAMUSCULAR | Status: DC

## 2018-11-01 MED ORDER — LORAZEPAM 1 MG PO TABS: 1.0000 mg | ORAL_TABLET | Freq: Four times a day (QID) | ORAL | Status: DC | PRN

## 2018-11-03 NOTE — ED Notes (Signed)
Bed: RESB Expected date:  Expected time:  Means of arrival:  Comments: 63 yo M  abd pain  Hypotension

## 2018-11-03 NOTE — Progress Notes (Signed)
Drops placed in eyes and eyes covered with normal saline moistened and dry gauze; secured with tape. Donne Hazel, RN

## 2018-11-03 NOTE — Progress Notes (Signed)
Called to room by family. Patient has expired as evidenced by no audible heartbeat and no respirations observed. Gregory Rowe, charge nurse, verified no heart beat and no respirations. Hydrographic surveyor Jinny Sanders) called. Dr Marthenia Rolling paged. Donne Hazel, RN

## 2018-11-03 NOTE — ED Notes (Addendum)
Patient transported to X-ray 

## 2018-11-03 NOTE — ED Provider Notes (Signed)
[facility] DEPT Provider Note: Georgena Spurling, MD, FACEP  CSN: [id number] MRN: [id number] ARRIVAL: 11/16/18 at [place]: RESB/RESB   CHIEF COMPLAINT  Abdominal Pain   HISTORY OF PRESENT ILLNESS  11/16/18 4:03 AM Gregory Rowe is a 63 y.o. male with a history of alcoholism and liver cancer.  He was admitted to the hospital in November for symptomatic anemia with a hemoglobin of 3.  He is here with abdominal pain since yesterday morning.  He states the pain is a 6 out of 10 and describes it as varying in quality.  It is somewhat worse with movement or palpation.  He has had some nausea and vomiting but not severe.  He denies diarrhea.  He states the pain is making him feel short of breath.  He was found to be hypotensive in the 28B systolic by EMS.  He was given 1400 mL of normal saline with improvement to 91/53.  He continues to be tachycardic on arrival.   Past Medical History:  Diagnosis Date  . Alcoholism ([place])   . Anemia   . Hyponatremia   . Liver cancer ([place])     History reviewed. No pertinent surgical history.  History reviewed. No pertinent family history.  Social History   Tobacco Use  . Smoking status: Never Smoker  . Smokeless tobacco: Current User    Types: Snuff  Substance Use Topics  . Alcohol use: Yes    Alcohol/week: 4.0 - 5.0 standard drinks    Types: 4 - 5 Cans of beer per week  . Drug use: Never    Prior to Admission medications   Medication Sig Start Date End Date Taking? Authorizing Provider  feeding supplement, ENSURE ENLIVE, (ENSURE ENLIVE) LIQD Take 237 mLs by mouth 3 (three) times daily between meals. Patient taking differently: Take 237 mLs by mouth daily.  09/24/18  Yes Dana Allan I, MD  furosemide (LASIX) 20 MG tablet Take 1 tablet (20 mg total) by mouth daily. 09/24/18 09/24/19 Yes Bonnell Public, MD  folic acid (FOLVITE) 1 MG tablet Take 1 tablet (1 mg total) by mouth daily. Patient not taking: Reported on 11/16/2018  09/25/18   Dana Allan I, MD  megestrol (MEGACE) 400 MG/10ML suspension Take 10 mLs (400 mg total) by mouth 2 (two) times daily. Patient not taking: Reported on 11-16-2018 09/24/18   Dana Allan I, MD  spironolactone (ALDACTONE) 100 MG tablet Take 1 tablet (100 mg total) by mouth daily. Patient not taking: Reported on 11-16-18 09/25/18   Dana Allan I, MD  vitamin B-12 1000 MCG tablet Take 1 tablet (1,000 mcg total) by mouth daily. Patient not taking: Reported on November 16, 2018 09/25/18   Dana Allan I, MD    Allergies Other   REVIEW OF SYSTEMS  Negative except as noted here or in the History of Present Illness.   PHYSICAL EXAMINATION  Initial Vital Signs Blood pressure (!) 91/53, pulse (!) 122, temperature 98.7 F (37.1 C), temperature source Oral, resp. rate (!) 34, height 5\' 8"  (1.727 m), weight 65.8 kg, SpO2 95 %.  Examination General: Well-developed, well-nourished male in no acute distress; appearance consistent with age of record HENT: normocephalic; atraumatic Eyes: pupils equal, round and reactive to light; extraocular muscles intact Neck: supple Heart: regular rate and rhythm Lungs: clear to auscultation bilaterally Abdomen: soft; nondistended; diffuse tenderness; hepatomegaly; bowel sounds present Extremities: No deformity; full range of motion; pulses normal Neurologic: Awake, alert and oriented; motor function intact in all extremities and symmetric; no facial droop  Skin: Warm and dry Psychiatric: Flat affect   RESULTS  Summary of this visit's results, reviewed by myself:   EKG Interpretation  Date/Time:    Ventricular Rate:    PR Interval:    QRS Duration:   QT Interval:    QTC Calculation:   R Axis:     Text Interpretation:        Laboratory Studies: Results for orders placed or performed during the hospital encounter of 2018/11/24 (from the past 24 hour(s))  CBC with Differential     Status: Abnormal   Collection Time:  24-Nov-2018  3:55 AM  Result Value Ref Range   WBC 9.6 4.0 - 10.5 K/uL   RBC 2.53 (L) 4.22 - 5.81 MIL/uL   Hemoglobin 6.8 (LL) 13.0 - 17.0 g/dL   HCT 23.0 (L) 39.0 - 52.0 %   MCV 90.9 80.0 - 100.0 fL   MCH 26.9 26.0 - 34.0 pg   MCHC 29.6 (L) 30.0 - 36.0 g/dL   RDW 18.3 (H) 11.5 - 15.5 %   Platelets 440 (H) 150 - 400 K/uL   nRBC 0.0 0.0 - 0.2 %   Neutrophils Relative % 61 %   Lymphocytes Relative 14 %   Monocytes Relative 5 %   Eosinophils Relative 0 %   Basophils Relative 0 %   Band Neutrophils 10 %   Metamyelocytes Relative 6 %   Myelocytes 4 %   Promyelocytes Relative 0 %   Blasts 0 %   nRBC 0 0 /100 WBC   Other 0 %   Neutro Abs 7.8 (H) 1.7 - 7.7 K/uL   Lymphs Abs 1.3 0.7 - 4.0 K/uL   Monocytes Absolute 0.5 0.1 - 1.0 K/uL   Eosinophils Absolute 0.0 0.0 - 0.5 K/uL   Basophils Absolute 0.0 0.0 - 0.1 K/uL   RBC Morphology POLYCHROMASIA PRESENT    WBC Morphology MILD LEFT SHIFT (1-5% METAS, OCC MYELO, OCC BANDS)   Comprehensive metabolic panel     Status: Abnormal   Collection Time: 11/24/2018  3:55 AM  Result Value Ref Range   Sodium 130 (L) 135 - 145 mmol/L   Potassium 3.0 (L) 3.5 - 5.1 mmol/L   Chloride 98 98 - 111 mmol/L   CO2 17 (L) 22 - 32 mmol/L   Glucose, Bld 77 70 - 99 mg/dL   BUN 19 8 - 23 mg/dL   Creatinine, Ser 1.54 (H) 0.61 - 1.24 mg/dL   Calcium 7.5 (L) 8.9 - 10.3 mg/dL   Total Protein 5.6 (L) 6.5 - 8.1 g/dL   Albumin 2.1 (L) 3.5 - 5.0 g/dL   AST 28 15 - 41 U/L   ALT 10 0 - 44 U/L   Alkaline Phosphatase 64 38 - 126 U/L   Total Bilirubin 0.8 0.3 - 1.2 mg/dL   GFR calc non Af Amer 48 (L) >60 mL/min   GFR calc Af Amer 55 (L) >60 mL/min   Anion gap 15 5 - 15  Lipase, blood     Status: None   Collection Time: November 24, 2018  3:55 AM  Result Value Ref Range   Lipase 26 11 - 51 U/L  I-Stat CG4 Lactic Acid, ED     Status: Abnormal   Collection Time: 11-24-2018  4:14 AM  Result Value Ref Range   Lactic Acid, Venous 7.52 (HH) 0.5 - 1.9 mmol/L   Comment NOTIFIED  PHYSICIAN   Ammonia     Status: None   Collection Time: 11-24-18  4:26 AM  Result  Value Ref Range   Ammonia 16 9 - 35 umol/L  I-Stat CG4 Lactic Acid, ED     Status: Abnormal   Collection Time: 12-Nov-2018  6:14 AM  Result Value Ref Range   Lactic Acid, Venous 4.19 (HH) 0.5 - 1.9 mmol/L   Comment NOTIFIED PHYSICIAN    Imaging Studies: Dg Chest 2 View  Result Date: Nov 12, 2018 CLINICAL DATA:  Acute onset of nausea, vomiting and shortness of breath. EXAM: CHEST - 2 VIEW COMPARISON:  Chest radiograph performed 09/17/2018 FINDINGS: The lungs are mildly hypoexpanded. Mild bibasilar opacities likely reflect atelectasis. No pleural effusion or pneumothorax is seen. The cardiomediastinal silhouette is borderline enlarged. No acute osseous abnormalities are identified. There is an incompletely healed chronic fracture through the left humeral neck, with associated healing response. A large amount of free intra-abdominal air is noted under the diaphragm. IMPRESSION: 1. Lungs mildly hypoexpanded. Mild bibasilar airspace opacities likely reflect atelectasis. 2. Borderline cardiomegaly. 3. Incompletely healed chronic fracture through the left humeral neck, with associated healing response. 4. Large amount of free intra-abdominal air noted under the diaphragm. Critical Value/emergent results were called by telephone at the time of interpretation on 12-Nov-2018 at 4:58 am to Dr. Shanon Rosser, who verbally acknowledged these results. Electronically Signed   By: Garald Balding M.D.   On: 12-Nov-2018 04:58    ED COURSE and MDM  Nursing notes and initial vitals signs, including pulse oximetry, reviewed.  Vitals:   11/12/2018 0530 11-12-18 0545 11/12/18 0550 November 12, 2018 0632  BP: 98/63  (!) 86/46 (!) 75/45  Pulse:  (!) 104 (!) 119 (!) 121  Resp:   18 (!) 33  Temp:      TempSrc:      SpO2:  (!) 84% 100% 100%  Weight:      Height:       4:36 AM Patient's lactate is significantly elevated at 7.52.  IV fluid bolus  infusing.   5:07 AM Patient and his son made aware of x-ray findings showing free air.  The patient does not wish to have surgery as he realizes he is dying.  He and his son would like him to be admitted for comfort measures.   PROCEDURES   CRITICAL CARE Performed by: Karen Chafe Sheritta Deeg Total critical care time: 35 minutes Critical care time was exclusive of separately billable procedures and treating other patients. Critical care was necessary to treat or prevent imminent or life-threatening deterioration. Critical care was time spent personally by me on the following activities: development of treatment plan with patient and/or surrogate as well as nursing, discussions with consultants, evaluation of patient's response to treatment, examination of patient, obtaining history from patient or surrogate, ordering and performing treatments and interventions, ordering and review of laboratory studies, ordering and review of radiographic studies, pulse oximetry and re-evaluation of patient's condition.   ED DIAGNOSES     ICD-10-CM   1. Perforated viscus R19.8   2. Comfort measures only status Z51.5        Dewon Mendizabal, Jenny Reichmann, MD Nov 12, 2018 [phone]

## 2018-11-03 NOTE — H&P (Signed)
History and Physical    Gregory Rowe HQR:[id number] DOB: [birth year] DOA: 25-Nov-2018  PCP: Patient, No Pcp Per  Patient coming from: Home.  Chief Complaint: Abdominal pain nausea vomiting.  HPI: Gregory Rowe is a 62 y.o. 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  Has been having the symptoms for last 24 hours.  Pain is diffuse.  Has had a bowel movement yesterday.  During last month patient has declined EGD and colonoscopy and did not want any aggressive measures.  ED Course: In the ER patient is tachycardic hypotensive and hemoglobin is further drop from baseline of 9 to now 6.  Lactic acid was elevated at 7 which improved with fluids.  X-ray showed air under the diaphragm concerning for perforated viscus.  On exam patient has rigid abdomen corroborating with possible perforated viscus.  At this time after discussing with patient and patient's son plan is to have patient only comfort measures.  Plan was to get a CAT scan but since patient's condition is further deteriorated and son at this time is requested only comfort measures CAT scan was canceled after discussing with patient's son.  Review of Systems: As per HPI, rest all negative.   Past Medical History:  Diagnosis Date  . Alcoholism ([place])   . Anemia   . Hyponatremia   . Liver cancer ([place])     History reviewed. No pertinent surgical history.   reports that he has never smoked. His smokeless tobacco use includes snuff. He reports current alcohol use of about 4.0 - 5.0 standard drinks of alcohol per week. He reports that he does not use drugs.  Allergies  Allergen Reactions  . Other Other (See Comments)    NARCOTICS; makes him angry    History reviewed. No pertinent family  history.  Prior to Admission medications   Medication Sig Start Date End Date Taking? Authorizing Provider  feeding supplement, ENSURE ENLIVE, (ENSURE ENLIVE) LIQD Take 237 mLs by mouth 3 (three) times daily between meals. Patient taking differently: Take 237 mLs by mouth daily.  09/24/18  Yes Dana Allan I, MD  furosemide (LASIX) 20 MG tablet Take 1 tablet (20 mg total) by mouth daily. 09/24/18 09/24/19 Yes Bonnell Public, MD  folic acid (FOLVITE) 1 MG tablet Take 1 tablet (1 mg total) by mouth daily. Patient not taking: Reported on 25-Nov-2018 09/25/18   Dana Allan I, MD  megestrol (MEGACE) 400 MG/10ML suspension Take 10 mLs (400 mg total) by mouth 2 (two) times daily. Patient not taking: Reported on 25-Nov-2018 09/24/18   Dana Allan I, MD  spironolactone (ALDACTONE) 100 MG tablet Take 1 tablet (100 mg total) by mouth daily. Patient not taking: Reported on 11-25-2018 09/25/18   Dana Allan I, MD  vitamin B-12 1000 MCG tablet Take 1 tablet (1,000 mcg total) by mouth daily. Patient not taking: Reported on 11-25-18 09/25/18   Bonnell Public, MD    Physical Exam: Vitals:   2018-11-25 0510 11-25-18 0530 2018/11/25 0545 2018/11/25 0550  BP: (!) 101/59 98/63  (!) 86/46  Pulse:   (!) 104 (!) 119  Resp:    18  Temp:      TempSrc:      SpO2:   (!) 84% 100%  Weight:  Height:          Constitutional: Moderately built and nourished. Vitals:   11-23-2018 0510 11/23/2018 0530 November 23, 2018 0545 11-23-2018 0550  BP: (!) 101/59 98/63  (!) 86/46  Pulse:   (!) 104 (!) 119  Resp:    18  Temp:      TempSrc:      SpO2:   (!) 84% 100%  Weight:      Height:       Eyes: Anicteric no pallor. ENMT: No discharge from the ears eyes nose or mouth. Neck: No mass felt.  No neck rigidity. Respiratory: No rhonchi or crepitations. Cardiovascular: S1-S2 heard. Abdomen: Rigid rebound tenderness present bowel sounds not appreciated.  No guarding or rigidity. Musculoskeletal: No  edema. Skin: No rash. Neurologic: Alert awake oriented to time place and person.  Moves all extremities. Psychiatric: Appears normal.   Labs on Admission: I have personally reviewed following labs and imaging studies  CBC: Recent Labs  Lab 11-23-2018 0355  WBC 9.6  NEUTROABS 7.8*  HGB 6.8*  HCT 23.0*  MCV 90.9  PLT 330*   Basic Metabolic Panel: Recent Labs  Lab 11-23-18 0355  NA 130*  K 3.0*  CL 98  CO2 17*  GLUCOSE 77  BUN 19  CREATININE 1.54*  CALCIUM 7.5*   GFR: Estimated Creatinine Clearance: 46.3 mL/min (A) (by C-G formula based on SCr of 1.54 mg/dL (H)). Liver Function Tests: Recent Labs  Lab 11-23-18 0355  AST 28  ALT 10  ALKPHOS 64  BILITOT 0.8  PROT 5.6*  ALBUMIN 2.1*   Recent Labs  Lab 23-Nov-2018 0355  LIPASE 26   Recent Labs  Lab 2018/11/23 0426  AMMONIA 16   Coagulation Profile: No results for input(s): INR, PROTIME in the last 168 hours. Cardiac Enzymes: No results for input(s): CKTOTAL, CKMB, CKMBINDEX, TROPONINI in the last 168 hours. BNP (last 3 results) Recent Labs    09/15/18 1203  PROBNP 776.0*   HbA1C: No results for input(s): HGBA1C in the last 72 hours. CBG: No results for input(s): GLUCAP in the last 168 hours. Lipid Profile: No results for input(s): CHOL, HDL, LDLCALC, TRIG, CHOLHDL, LDLDIRECT in the last 72 hours. Thyroid Function Tests: No results for input(s): TSH, T4TOTAL, FREET4, T3FREE, THYROIDAB in the last 72 hours. Anemia Panel: No results for input(s): VITAMINB12, FOLATE, FERRITIN, TIBC, IRON, RETICCTPCT in the last 72 hours. Urine analysis:    Component Value Date/Time   COLORURINE AMBER (A) 09/18/2018 0827   APPEARANCEUR HAZY (A) 09/18/2018 0827   LABSPEC 1.011 09/18/2018 0827   PHURINE 5.0 09/18/2018 0827   GLUCOSEU NEGATIVE 09/18/2018 0827   HGBUR SMALL (A) 09/18/2018 0827   BILIRUBINUR NEGATIVE 09/18/2018 0827   KETONESUR 5 (A) 09/18/2018 0827   PROTEINUR NEGATIVE 09/18/2018 0827   NITRITE NEGATIVE  09/18/2018 0827   LEUKOCYTESUR TRACE (A) 09/18/2018 0827   Sepsis Labs: @LABRCNTIP (procalcitonin:4,lacticidven:4) )No results found for this or any previous visit (from the past 240 hour(s)).   Radiological Exams on Admission: Dg Chest 2 View  Result Date: 11-23-18 CLINICAL DATA:  Acute onset of nausea, vomiting and shortness of breath. EXAM: CHEST - 2 VIEW COMPARISON:  Chest radiograph performed 09/17/2018 FINDINGS: The lungs are mildly hypoexpanded. Mild bibasilar opacities likely reflect atelectasis. No pleural effusion or pneumothorax is seen. The cardiomediastinal silhouette is borderline enlarged. No acute osseous abnormalities are identified. There is an incompletely healed chronic fracture through the left humeral neck, with associated healing response. A large amount of free intra-abdominal air  is noted under the diaphragm. IMPRESSION: 1. Lungs mildly hypoexpanded. Mild bibasilar airspace opacities likely reflect atelectasis. 2. Borderline cardiomegaly. 3. Incompletely healed chronic fracture through the left humeral neck, with associated healing response. 4. Large amount of free intra-abdominal air noted under the diaphragm. Critical Value/emergent results were called by telephone at the time of interpretation on November 09, 2018 at 4:58 am to Dr. Shanon Rosser, who verbally acknowledged these results. Electronically Signed   By: Garald Balding M.D.   On: Nov 09, 2018 04:58     Assessment/Plan Principal Problem:   Perforated viscus Active Problems:   Symptomatic anemia   Loculated pleural effusion   Anasarca    1. Acute perforated viscus. 2. Stage IV adenocarcinoma likely origin could be colon. 3. Acute blood loss anemia. 4. Alcoholism. 5. Lactic acidosis likely from dehydration and perforated viscus.  Plan -after having detailed discussion with patient and patient's son plan is made patient to be only comfort measures.  Per patient's son patient does better with pain relief on  fentanyl which has been ordered and also placed on CIWA protocol for alcohol which also will help with comfort.  Palliative care has been consulted.  No labs will be ordered.  Patient is a DNR and comfort measures.   DVT prophylaxis: SCDs. Code Status: DNR. Family Communication: Patient's son. Disposition Plan: To be determined. Consults called: Palliative care. Admission status: Inpatient.   Rise Patience MD [facility] Pager [phone].  If 7PM-7AM, please contact night-coverage [email or web address] Password [email or web address]  11/09/2018, 6:16 AM

## 2018-11-03 NOTE — ED Notes (Addendum)
Patient returned from X-ray 

## 2018-11-03 NOTE — ED Notes (Signed)
Date and time results received: November 18, 2018  4:52 AM Test: Hgb Critical Value: 6.8  Name of Provider Notified: Dr. Florina Ou Orders Received? Or Actions Taken?: Will follow up

## 2018-11-03 NOTE — Death Summary Note (Signed)
Death Summary  Gregory Rowe NOM:[id number] DOB: [birth year] DOA: Nov 23, 2018  PCP: Patient, No Pcp Per  Admit date: 11-23-2018 Date of Death: Nov 23, 2018 Time of Death: 14:55 Notification: Patient, No Pcp Per notified of death of 2018/11/23   History of present illness:  Gregory Rowe is a 63 y.o. male with a history of alcoholism, severe anemia, stage IV adenocarcinoma presumed to be of the colon Grover Canavan presented with complaint of worsening abdominal pain, nausea and vomiting Grover Canavan on admission was found to have significantly elevated lactic acid of 7, and abdominal x-ray concerning for perforated viscus.  Patient had rigid abdomen pressure related to perforated viscus.  Upon discussion with patient and his son, elected for comfort care measures only.  At that time further imaging was discontinued.  Palliative care was consulted and expected demise in hospital given significant clinical deterioration.  Time of death was 2:55 PM.   Final Diagnoses:  1.  Acute perforated viscus   The results of significant diagnostics from this hospitalization (including imaging, microbiology, ancillary and laboratory) are listed below for reference.    Significant Diagnostic Studies: Dg Chest 2 View  Result Date: 2018-11-23 CLINICAL DATA:  Acute onset of nausea, vomiting and shortness of breath. EXAM: CHEST - 2 VIEW COMPARISON:  Chest radiograph performed 09/17/2018 FINDINGS: The lungs are mildly hypoexpanded. Mild bibasilar opacities likely reflect atelectasis. No pleural effusion or pneumothorax is seen. The cardiomediastinal silhouette is borderline enlarged. No acute osseous abnormalities are identified. There is an incompletely healed chronic fracture through the left humeral neck, with associated healing response. A large amount of free intra-abdominal air is noted under the diaphragm. IMPRESSION: 1. Lungs mildly hypoexpanded. Mild bibasilar airspace opacities likely reflect  atelectasis. 2. Borderline cardiomegaly. 3. Incompletely healed chronic fracture through the left humeral neck, with associated healing response. 4. Large amount of free intra-abdominal air noted under the diaphragm. Critical Value/emergent results were called by telephone at the time of interpretation on 11-23-18 at 4:58 am to Dr. Shanon Rosser, who verbally acknowledged these results. Electronically Signed   By: Garald Balding M.D.   On: 2018-11-23 04:58    Microbiology: Recent Results (from the past 240 hour(s))  Blood culture (routine x 2)     Status: None (Preliminary result)   Collection Time: 2018-11-23  4:01 AM  Result Value Ref Range Status   Specimen Description   Final    BLOOD RIGHT ANTECUBITAL Performed at [facility] [address] [place], [place] [address]    Special Requests   Final    BOTTLES DRAWN AEROBIC AND ANAEROBIC Blood Culture adequate volume Performed at [facility] [address], [place], [place] [address]    Culture   Final    NO GROWTH < 12 HOURS Performed at [facility] [address] [place], [place] [address]    Report Status PENDING  Incomplete  Blood culture (routine x 2)     Status: None (Preliminary result)   Collection Time: 11/23/18  4:15 AM  Result Value Ref Range Status   Specimen Description   Final    BLOOD BLOOD LEFT FOREARM Performed at [facility] [address] [place], [place] [address]    Special Requests   Final    BOTTLES DRAWN AEROBIC AND ANAEROBIC Blood Culture results may not be optimal due to an inadequate volume of blood received in culture bottles Performed at [facility] [address], [place], [place] [address]  Culture   Final    NO GROWTH < 12 HOURS Performed at [facility], [place] [address], [place], [place] [address]    Report Status PENDING  Incomplete     Labs: Basic Metabolic Panel: Recent Labs  Lab  11/30/2018 0355  NA 130*  K 3.0*  CL 98  CO2 17*  GLUCOSE 77  BUN 19  CREATININE 1.54*  CALCIUM 7.5*   Liver Function Tests: Recent Labs  Lab 30-Nov-2018 0355  AST 28  ALT 10  ALKPHOS 64  BILITOT 0.8  PROT 5.6*  ALBUMIN 2.1*   Recent Labs  Lab 11/30/18 0355  LIPASE 26   Recent Labs  Lab 11/30/2018 0426  AMMONIA 16   CBC: Recent Labs  Lab 11-30-2018 0355  WBC 9.6  NEUTROABS 7.8*  HGB 6.8*  HCT 23.0*  MCV 90.9  PLT 440*   Cardiac Enzymes: No results for input(s): CKTOTAL, CKMB, CKMBINDEX, TROPONINI in the last 168 hours. D-Dimer No results for input(s): DDIMER in the last 72 hours. BNP: Invalid input(s): POCBNP CBG: No results for input(s): GLUCAP in the last 168 hours. Anemia work up No results for input(s): VITAMINB12, FOLATE, FERRITIN, TIBC, IRON, RETICCTPCT in the last 72 hours. Urinalysis    Component Value Date/Time   COLORURINE AMBER (A) 09/18/2018 0827   APPEARANCEUR HAZY (A) 09/18/2018 0827   LABSPEC 1.011 09/18/2018 0827   PHURINE 5.0 09/18/2018 0827   GLUCOSEU NEGATIVE 09/18/2018 0827   HGBUR SMALL (A) 09/18/2018 0827   BILIRUBINUR NEGATIVE 09/18/2018 0827   KETONESUR 5 (A) 09/18/2018 0827   PROTEINUR NEGATIVE 09/18/2018 0827   NITRITE NEGATIVE 09/18/2018 0827   LEUKOCYTESUR TRACE (A) 09/18/2018 0827   Sepsis Labs Invalid input(s): PROCALCITONIN,  WBC,  LACTICIDVEN     SIGNED:  Desiree Hane, MD  [facility] 11/30/2018, 6:29 PM Pager   If 7PM-7AM, please contact night-coverage [email or web address] Password [email or web address]

## 2018-11-03 NOTE — Consult Note (Signed)
63 yo actively dying of liver cancer. Currently comfortable. Has PRN medications. Respirations are shallow. I have discussed his condition, terminal care and comfort with multiple family members at bedside. I also provided support and an opportunity for life review.  Will add on q43min prns for comfort. I also removed his O2 nasal cannula. I answered all of their questions and addressed concerns.  DNR Prognosis: hours Hospital Death Anticipated  Lane Hacker, DO Palliative Medicine [phone]  Time: 30 min Greater than 50%  of this time was spent counseling and coordinating care related to the above assessment and plan.

## 2018-11-03 NOTE — ED Notes (Signed)
ED TO INPATIENT HANDOFF REPORT  Name/Age/Gender Gregory Rowe 63 y.o. male  Code Status    Code Status Orders  (From admission, onward)         Start     Ordered   11/25/2018 0507  Do not attempt resuscitation/DNR  Continuous    Question Answer Comment  In the event of cardiac or respiratory ARREST Do not call a "code blue"   In the event of cardiac or respiratory ARREST Do not perform Intubation, CPR, defibrillation or ACLS   In the event of cardiac or respiratory ARREST Use medication by any route, position, wound care, and other measures to relive pain and suffering. May use oxygen, suction and manual treatment of airway obstruction as needed for comfort.      2018/11/25 0506        Code Status History    Date Active Date Inactive Code Status Order ID Comments User Context   09/16/2018 1351 09/24/2018 2350 Full Code [id number]  Annita Brod, MD Inpatient   09/09/2018 1751 09/12/2018 1834 Full Code [id number]  Rigoberto Noel, MD ED      Home/SNF/Other Home  Chief Complaint Abdominal Pain;Emesis  Level of Care/Admitting Diagnosis ED Disposition    ED Disposition Condition Comment   Admit  The patient appears reasonably stabilized for admission considering the current resources, flow, and capabilities available in the ED at this time, and I doubt any other [facility] requiring further screening and/or treatment in the ED prior to admission is  present.       Medical History Past Medical History:  Diagnosis Date  . Alcoholism ([place])   . Anemia   . Hyponatremia   . Liver cancer ([facility])     Allergies Allergies  Allergen Reactions  . Other Other (See Comments)    NARCOTICS; makes him angry    IV Location/Drains/Wounds Patient Lines/Drains/Airways Status   Active Line/Drains/Airways    Name:   Placement date:   Placement time:   Site:   Days:   Peripheral IV 09/20/18 Right Antecubital   09/20/18    1807    Antecubital   42   Peripheral IV 11-25-2018 Left Forearm    November 25, 2018    0415    Forearm   less than 1          Labs/Imaging Results for orders placed or performed during the hospital encounter of 2018-11-25 (from the past 48 hour(s))  CBC with Differential     Status: Abnormal   Collection Time: 25-Nov-2018  3:55 AM  Result Value Ref Range   WBC 9.6 4.0 - 10.5 K/uL   RBC 2.53 (L) 4.22 - 5.81 MIL/uL   Hemoglobin 6.8 (LL) 13.0 - 17.0 g/dL    Comment: REPEATED TO VERIFY THIS CRITICAL RESULT HAS VERIFIED AND BEEN CALLED TO L,Jaidee Stipe BY AISHA MOHAMED ON 12 30 2019 AT 0448, AND HAS BEEN READ BACK.     HCT 23.0 (L) 39.0 - 52.0 %   MCV 90.9 80.0 - 100.0 fL   MCH 26.9 26.0 - 34.0 pg   MCHC 29.6 (L) 30.0 - 36.0 g/dL   RDW 18.3 (H) 11.5 - 15.5 %   Platelets 440 (H) 150 - 400 K/uL   nRBC 0.0 0.0 - 0.2 %   Neutrophils Relative % 61 %   Lymphocytes Relative 14 %   Monocytes Relative 5 %   Eosinophils Relative 0 %   Basophils Relative 0 %   Band Neutrophils 10 %   Metamyelocytes Relative  6 %   Myelocytes 4 %   Promyelocytes Relative 0 %   Blasts 0 %   nRBC 0 0 /100 WBC   Other 0 %   Neutro Abs 7.8 (H) 1.7 - 7.7 K/uL   Lymphs Abs 1.3 0.7 - 4.0 K/uL   Monocytes Absolute 0.5 0.1 - 1.0 K/uL   Eosinophils Absolute 0.0 0.0 - 0.5 K/uL   Basophils Absolute 0.0 0.0 - 0.1 K/uL   RBC Morphology POLYCHROMASIA PRESENT    WBC Morphology MILD LEFT SHIFT (1-5% METAS, OCC MYELO, OCC BANDS)     Comment: Performed at [facility], [place] [address] [place], [place] [address]  Comprehensive metabolic panel     Status: Abnormal   Collection Time: 09-Nov-2018  3:55 AM  Result Value Ref Range   Sodium 130 (L) 135 - 145 mmol/L   Potassium 3.0 (L) 3.5 - 5.1 mmol/L   Chloride 98 98 - 111 mmol/L   CO2 17 (L) 22 - 32 mmol/L   Glucose, Bld 77 70 - 99 mg/dL   BUN 19 8 - 23 mg/dL   Creatinine, Ser 1.54 (H) 0.61 - 1.24 mg/dL   Calcium 7.5 (L) 8.9 - 10.3 mg/dL   Total Protein 5.6 (L) 6.5 - 8.1 g/dL   Albumin 2.1 (L) 3.5 - 5.0 g/dL   AST 28 15 - 41 U/L   ALT  10 0 - 44 U/L   Alkaline Phosphatase 64 38 - 126 U/L   Total Bilirubin 0.8 0.3 - 1.2 mg/dL   GFR calc non Af Amer 48 (L) >60 mL/min   GFR calc Af Amer 55 (L) >60 mL/min   Anion gap 15 5 - 15    Comment: Performed at [facility], [place] [address], [place], [place] [address]  Lipase, blood     Status: None   Collection Time: Nov 09, 2018  3:55 AM  Result Value Ref Range   Lipase 26 11 - 51 U/L    Comment: Performed at [facility], [place] [address] [place], [place] [address]  I-Stat CG4 Lactic Acid, ED     Status: Abnormal   Collection Time: 2018/11/09  4:14 AM  Result Value Ref Range   Lactic Acid, Venous 7.52 (HH) 0.5 - 1.9 mmol/L   Comment NOTIFIED PHYSICIAN   Ammonia     Status: None   Collection Time: November 09, 2018  4:26 AM  Result Value Ref Range   Ammonia 16 9 - 35 umol/L    Comment: Performed at [facility], [place] [address], [place], [place] [address]   Dg Chest 2 View  Result Date: 11/09/18 CLINICAL DATA:  Acute onset of nausea, vomiting and shortness of breath. EXAM: CHEST - 2 VIEW COMPARISON:  Chest radiograph performed 09/17/2018 FINDINGS: The lungs are mildly hypoexpanded. Mild bibasilar opacities likely reflect atelectasis. No pleural effusion or pneumothorax is seen. The cardiomediastinal silhouette is borderline enlarged. No acute osseous abnormalities are identified. There is an incompletely healed chronic fracture through the left humeral neck, with associated healing response. A large amount of free intra-abdominal air is noted under the diaphragm. IMPRESSION: 1. Lungs mildly hypoexpanded. Mild bibasilar airspace opacities likely reflect atelectasis. 2. Borderline cardiomegaly. 3. Incompletely healed chronic fracture through the left humeral neck, with associated healing response. 4. Large amount of free intra-abdominal air noted under the diaphragm. Critical Value/emergent results were called by telephone at the time of  interpretation on 2018/11/09 at 4:58 am to Dr. Shanon Rosser, who verbally acknowledged  these results. Electronically Signed   By: Garald Balding M.D.   On: 2018-11-09 04:58   None  Pending Labs Unresulted Labs (From admission, onward)    Start     Ordered   11/09/2018 0437  Blood culture (routine x 2)  BLOOD CULTURE X 2,   STAT     11-09-2018 0436          Vitals/Pain Today's Vitals   11-09-2018 0415 2018/11/09 0430 2018/11/09 0506 2018-11-09 0507  BP: (!) 67/45 (!) 91/55  (!) 102/55  Pulse: (!) 124     Resp: (!) 28 (!) 32 (!) 21   Temp:      TempSrc:      SpO2: 93%     Weight:      Height:      PainSc:        Isolation Precautions No active isolations  Medications Medications  sodium chloride 0.9 % bolus 2,000 mL (1,000 mLs Intravenous New Bag/Given 2018/11/09 0431)  iopamidol (ISOVUE-300) 61 % injection (has no administration in time range)  sodium chloride (PF) 0.9 % injection (has no administration in time range)  fentaNYL (SUBLIMAZE) injection 25 mcg (has no administration in time range)  promethazine (PHENERGAN) injection 12.5 mg (has no administration in time range)  ondansetron (ZOFRAN) injection 4 mg (4 mg Intravenous Given Nov 09, 2018 0409)  fentaNYL (SUBLIMAZE) injection 25 mcg (25 mcg Intravenous Given 11/09/18 0505)    Mobility walks with device

## 2018-11-03 DEATH — deceased

## 2018-11-06 LAB — CULTURE, BLOOD (ROUTINE X 2)
Culture: NO GROWTH
Culture: NO GROWTH
Special Requests: ADEQUATE

## 2020-08-17 IMAGING — US US THORACENTESIS ASP PLEURAL SPACE W/IMG GUIDE
1 series · 3 of 3 positions shown · non-contrast
Comparison: none

INDICATION: Patient with history of alcohol abuse, cough, dyspnea, left pleural
effusion. Request made for diagnostic and therapeutic left
thoracentesis.

[Series 1: us thoracentesis asp pleural space w/img guide · 3 of 3 slices shown]
[im 1/3]
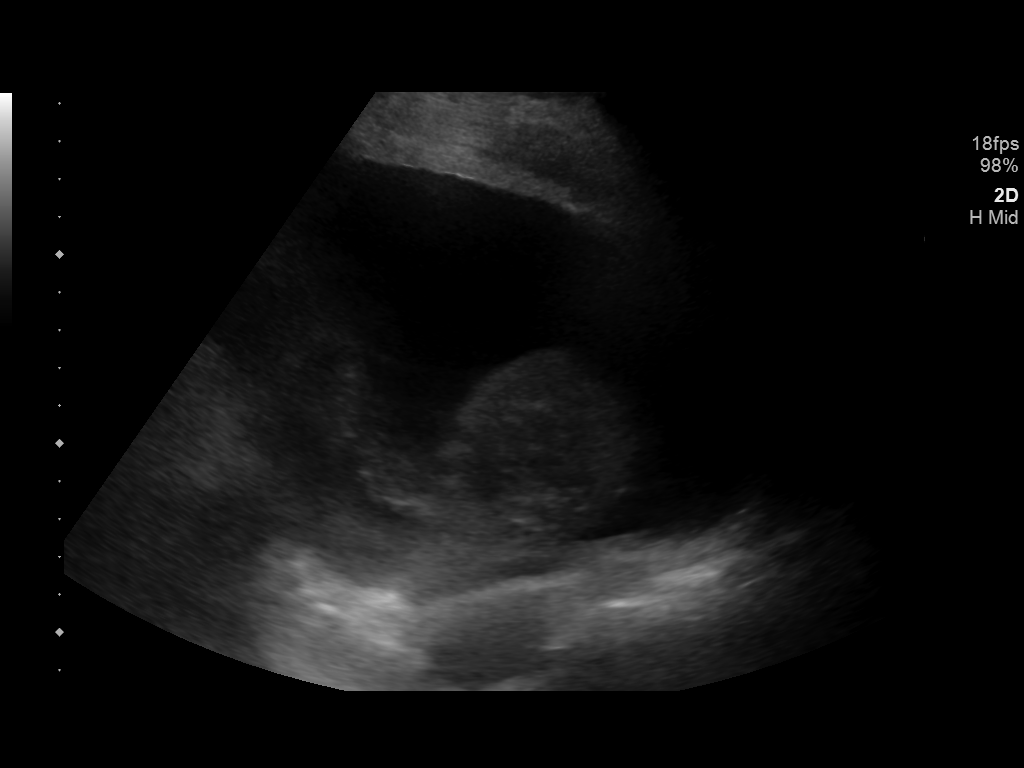
[im 2/3]
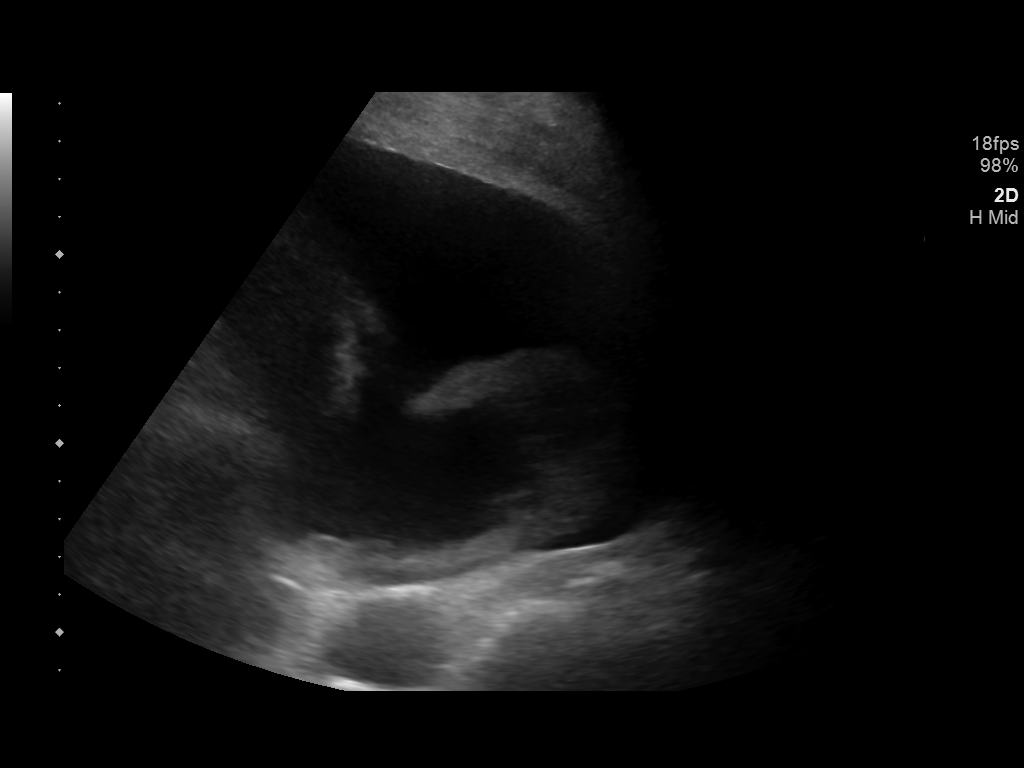
[im 3/3]
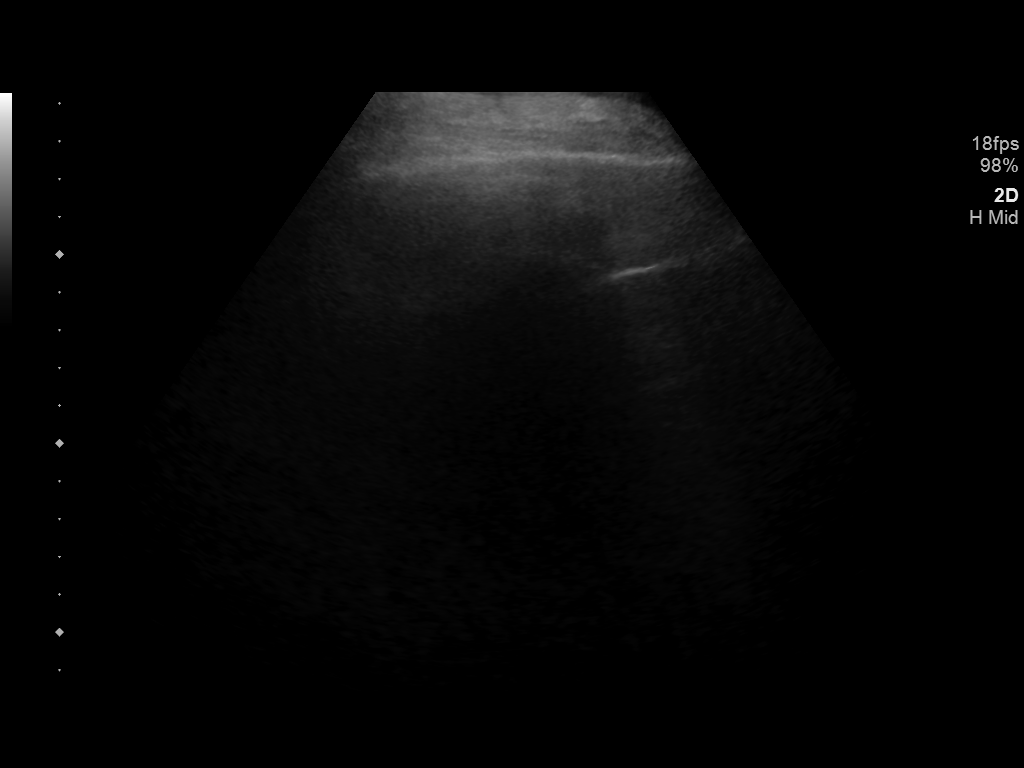

[3 of 3 positions shown; findings below may reference images not displayed]

EXAM:
ULTRASOUND GUIDED DIAGNOSTIC AND THERAPEUTIC LEFT THORACENTESIS

MEDICATIONS:
None

COMPLICATIONS:
None immediate.

PROCEDURE:
An ultrasound guided thoracentesis was thoroughly discussed with the
patient and questions answered. The benefits, risks, alternatives
and complications were also discussed. The patient understands and
wishes to proceed with the procedure. Written consent was obtained.

Ultrasound was performed to localize and mark an adequate pocket of
fluid in the left chest. The area was then prepped and draped in the
normal sterile fashion. 1% Lidocaine was used for local anesthesia.
Under ultrasound guidance a 6 Fr Safe-T-Centesis catheter was
introduced. Thoracentesis was performed. The catheter was removed
and a dressing applied.
FINDINGS: A total of approximately 710 cc of yellow fluid was removed. Samples
were sent to the laboratory as requested by the clinical team.
IMPRESSION: Successful ultrasound guided diagnostic and therapeutic left
thoracentesis yielding 710 cc of pleural fluid.
